# Patient Record
Sex: Female | Born: 1965 | Race: Black or African American | Hispanic: No | Smoking: Current every day smoker
Health system: Southern US, Community
[De-identification: ages and names within clinical notes are randomized; demographics above are authoritative.]

## PROBLEM LIST (undated history)

## (undated) DIAGNOSIS — E669 Obesity, unspecified: Secondary | ICD-10-CM

## (undated) DIAGNOSIS — J329 Chronic sinusitis, unspecified: Secondary | ICD-10-CM

## (undated) DIAGNOSIS — D869 Sarcoidosis, unspecified: Secondary | ICD-10-CM

## (undated) DIAGNOSIS — K279 Peptic ulcer, site unspecified, unspecified as acute or chronic, without hemorrhage or perforation: Secondary | ICD-10-CM

## (undated) HISTORY — DX: Peptic ulcer, site unspecified, unspecified as acute or chronic, without hemorrhage or perforation: K27.9

## (undated) HISTORY — PX: TUBAL LIGATION: SHX77

## (undated) HISTORY — PX: ABDOMINAL HYSTERECTOMY: SHX81

## (undated) HISTORY — DX: Chronic sinusitis, unspecified: J32.9

## (undated) HISTORY — PX: ELBOW SURGERY: SHX618

## (undated) HISTORY — PX: NASAL SINUS SURGERY: SHX719

## (undated) HISTORY — DX: Sarcoidosis, unspecified: D86.9

## (undated) HISTORY — DX: Obesity, unspecified: E66.9

---

## 1992-09-20 ENCOUNTER — Encounter: Payer: Self-pay | Admitting: Internal Medicine

## 1992-11-01 ENCOUNTER — Encounter: Payer: Self-pay | Admitting: Internal Medicine

## 1993-06-06 ENCOUNTER — Encounter: Payer: Self-pay | Admitting: Internal Medicine

## 1994-05-01 ENCOUNTER — Encounter: Payer: Self-pay | Admitting: Internal Medicine

## 1995-03-13 ENCOUNTER — Encounter: Payer: Self-pay | Admitting: Internal Medicine

## 1998-10-14 ENCOUNTER — Ambulatory Visit (HOSPITAL_COMMUNITY): Admission: RE | Admit: 1998-10-14 | Discharge: 1998-10-14 | Payer: Self-pay | Admitting: Obstetrics

## 1999-10-10 ENCOUNTER — Other Ambulatory Visit: Admission: RE | Admit: 1999-10-10 | Discharge: 1999-10-10 | Payer: Self-pay | Admitting: Obstetrics

## 1999-10-18 ENCOUNTER — Encounter: Payer: Self-pay | Admitting: Obstetrics

## 1999-10-18 ENCOUNTER — Ambulatory Visit (HOSPITAL_COMMUNITY): Admission: RE | Admit: 1999-10-18 | Discharge: 1999-10-18 | Payer: Self-pay | Admitting: Obstetrics

## 2000-11-28 ENCOUNTER — Other Ambulatory Visit: Admission: RE | Admit: 2000-11-28 | Discharge: 2000-11-28 | Payer: Self-pay | Admitting: Obstetrics

## 2000-12-04 ENCOUNTER — Encounter: Payer: Self-pay | Admitting: Obstetrics

## 2000-12-04 ENCOUNTER — Ambulatory Visit (HOSPITAL_COMMUNITY): Admission: RE | Admit: 2000-12-04 | Discharge: 2000-12-04 | Payer: Self-pay | Admitting: Obstetrics

## 2001-02-27 ENCOUNTER — Encounter: Admission: RE | Admit: 2001-02-27 | Discharge: 2001-02-27 | Payer: Self-pay | Admitting: Family Medicine

## 2001-02-27 ENCOUNTER — Encounter: Payer: Self-pay | Admitting: Family Medicine

## 2001-08-12 ENCOUNTER — Encounter (INDEPENDENT_AMBULATORY_CARE_PROVIDER_SITE_OTHER): Payer: Self-pay | Admitting: Specialist

## 2001-08-12 ENCOUNTER — Observation Stay (HOSPITAL_COMMUNITY): Admission: RE | Admit: 2001-08-12 | Discharge: 2001-08-13 | Payer: Self-pay | Admitting: Obstetrics and Gynecology

## 2001-11-19 ENCOUNTER — Other Ambulatory Visit: Admission: RE | Admit: 2001-11-19 | Discharge: 2001-11-19 | Payer: Self-pay | Admitting: Obstetrics and Gynecology

## 2002-02-25 ENCOUNTER — Ambulatory Visit (HOSPITAL_COMMUNITY): Admission: RE | Admit: 2002-02-25 | Discharge: 2002-02-25 | Payer: Self-pay | Admitting: Obstetrics and Gynecology

## 2002-02-25 ENCOUNTER — Encounter: Payer: Self-pay | Admitting: Obstetrics and Gynecology

## 2002-11-21 ENCOUNTER — Encounter: Admission: RE | Admit: 2002-11-21 | Discharge: 2002-11-21 | Payer: Self-pay | Admitting: Family Medicine

## 2002-11-21 ENCOUNTER — Encounter: Payer: Self-pay | Admitting: Family Medicine

## 2002-12-09 ENCOUNTER — Other Ambulatory Visit: Admission: RE | Admit: 2002-12-09 | Discharge: 2002-12-09 | Payer: Self-pay | Admitting: Obstetrics and Gynecology

## 2003-02-25 ENCOUNTER — Encounter (INDEPENDENT_AMBULATORY_CARE_PROVIDER_SITE_OTHER): Payer: Self-pay | Admitting: *Deleted

## 2003-02-25 ENCOUNTER — Ambulatory Visit (HOSPITAL_COMMUNITY): Admission: RE | Admit: 2003-02-25 | Discharge: 2003-02-25 | Payer: Self-pay | Admitting: Gastroenterology

## 2003-10-12 ENCOUNTER — Ambulatory Visit (HOSPITAL_COMMUNITY): Admission: RE | Admit: 2003-10-12 | Discharge: 2003-10-12 | Payer: Self-pay | Admitting: Obstetrics and Gynecology

## 2004-08-21 ENCOUNTER — Emergency Department (HOSPITAL_COMMUNITY): Admission: EM | Admit: 2004-08-21 | Discharge: 2004-08-21 | Payer: Self-pay | Admitting: Emergency Medicine

## 2004-10-19 ENCOUNTER — Ambulatory Visit (HOSPITAL_COMMUNITY): Admission: RE | Admit: 2004-10-19 | Discharge: 2004-10-19 | Payer: Self-pay | Admitting: Obstetrics and Gynecology

## 2005-10-23 ENCOUNTER — Ambulatory Visit (HOSPITAL_COMMUNITY): Admission: RE | Admit: 2005-10-23 | Discharge: 2005-10-23 | Payer: Self-pay | Admitting: Obstetrics and Gynecology

## 2006-02-20 ENCOUNTER — Encounter: Admission: RE | Admit: 2006-02-20 | Discharge: 2006-02-20 | Payer: Self-pay | Admitting: Family Medicine

## 2009-11-18 ENCOUNTER — Encounter: Payer: Self-pay | Admitting: Internal Medicine

## 2009-11-24 ENCOUNTER — Encounter: Payer: Self-pay | Admitting: Internal Medicine

## 2009-12-13 ENCOUNTER — Ambulatory Visit (HOSPITAL_COMMUNITY): Admission: RE | Admit: 2009-12-13 | Discharge: 2009-12-13 | Payer: Self-pay | Admitting: Obstetrics and Gynecology

## 2010-01-06 ENCOUNTER — Telehealth (INDEPENDENT_AMBULATORY_CARE_PROVIDER_SITE_OTHER): Payer: Self-pay | Admitting: *Deleted

## 2010-01-12 ENCOUNTER — Ambulatory Visit: Payer: Self-pay | Admitting: Internal Medicine

## 2010-01-12 ENCOUNTER — Encounter: Payer: Self-pay | Admitting: Internal Medicine

## 2010-01-12 DIAGNOSIS — J329 Chronic sinusitis, unspecified: Secondary | ICD-10-CM | POA: Insufficient documentation

## 2010-01-12 DIAGNOSIS — D869 Sarcoidosis, unspecified: Secondary | ICD-10-CM

## 2010-01-12 DIAGNOSIS — E669 Obesity, unspecified: Secondary | ICD-10-CM | POA: Insufficient documentation

## 2010-01-12 DIAGNOSIS — R05 Cough: Secondary | ICD-10-CM

## 2010-01-12 DIAGNOSIS — R059 Cough, unspecified: Secondary | ICD-10-CM | POA: Insufficient documentation

## 2010-01-12 DIAGNOSIS — R0602 Shortness of breath: Secondary | ICD-10-CM | POA: Insufficient documentation

## 2010-01-12 DIAGNOSIS — J209 Acute bronchitis, unspecified: Secondary | ICD-10-CM

## 2010-02-02 ENCOUNTER — Ambulatory Visit: Payer: Self-pay | Admitting: Internal Medicine

## 2010-08-02 NOTE — Letter (Signed)
Summary: [facility]   Imported By: Lester Loch Sheldrake 01/25/2010 09:55:17  _____________________________________________________________________  External Attachment:    Type:   Image     Comment:   External Document

## 2010-08-02 NOTE — Assessment & Plan Note (Signed)
Summary: rov/cxr/pft/apc   Visit Type:  followup Primary Jamontae Thwaites/Referring Zion Ta:  Dr.Veita Sharman Cheek  CC:  followup on pfts; a little dry cough nonproductive and pt denies sob.  History of Present Illness: IOv 01/12/2010: 45 year old AA female. Smoker. 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 Known Pulmonary Sarcoid since age 43 (see past med hx) with sarcoid sinus but asymptomatic for 19 years in terms of cough and 10 years in terms of sinusitis (s/p sinus surgery in last 13s)  States that ever since March 2011 she is having recurrnig sinusitis and bronchitis. In March 2011, developed cough, sore throat, and green sinus drainage. Noted to have fluid in left ear per hx. Diagnosed with strep throat. Rx with antibiotics and an inhaler. 2-3 days after finshing this course of abx she noticed dizziness around 10/04/2009. Reportedly found to have fluid now in left ear. Rx with meclizine. Then again 4 day acute sinus and bronchitis symtoms mid-may 2011. Again got abx. Not better. So back at Dr. Parke Simmers office on 11/24/2009. Noticed to be wheezing for first time. Got nebs in office. But by 12/06/2009 followup she was asymptmatic. But on  12/21/2009 developed green sinus drainage. Did not take abx this time but self medicated with netti pot and this has helped significantly. Currently feels fine.  She is concerned why she is having recurrent sinusitis and bronchiitis (typically has only 1 episode a year). She is worried that this might be sarcoid.  She does have a chronic dry cough since March 2011 when initial symptoms begain. Present day and night. No specific aggravating or relieving factors. STable since onset. Rates cough as mild to moderate in severity. No associated fever.  bu does have some associated mild stable dyspnea while climbing steps; also present since March 2011.   REC CXR -> Normal on 01/12/2010 Full PFT Netti pot for sinus drainageOV 02/02/2010. She is now using netti pot for sinus drainage and this has  helped cough significantly. STill some residual cough rated as 4 of 10. Cough is dry. Happens only intermittently. Feels it early in the morning. No clear cut aggravating or relieving factors. She is attending gym and dyspnea is resolved. CXR 01/12/2010 is clear. PFTs 8/32/2011 are normal but for very mild borderline reduction in DLCO to 21/3/78%.  STill smoking though  Preventive Screening-Counseling & Management  Alcohol-Tobacco     Smoking Status: current     Smoking Cessation Counseling: yes     Smoke Cessation Stage: contemplative     Packs/Day: 0.25     Year Started: 1991     Tobacco Counseling: to quit use of tobacco products  Current Medications (verified): 1)  None  Allergies (verified): 1)  ! Codeine 2)  ! 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 (1 pack a week x 20 years)  Risk Factors: Smoking Status: current (02/02/2010) Packs/Day: 0.25 (02/02/2010)  Past Medical History: Reviewed history from 01/12/2010 and no changes required. SARCOIDOSIS (ICD-135) Hx of ACUTE BRONCHITIS (ICD-466.0) OBESITY (ICD-278.00)    Past Surgical History: Reviewed history from 01/12/2010 and no changes required. Sinus surgery-1999 Partial Hysterectomy Left arm surgery (benign grown removal)  Past Pulmonary History:  Pulmonary History: #SARCOID > diagnosed age 56.  > had bald spot in scalp ("dime size") - bx  by Dr Lonni Fix reportedly showed sarcoid > also cough at diagnosis - cxr reportedly showed pulmonary sarcoid. Rx with steroids for 1 year > also subsequent diagnosis of sarcoid in nose in 1999  via biopsy; had chronic sinusitis for years with multiple ER visits  and heaeaches that was reportedly managed by Dr. Jerilee Hoh at that time. Not treated with prednisone but reportedly had some kind of sinus surgery > concern that right elbow pain was sarcoid at [facility] in [facility] > FU CT chest 11/21/2002 (per report only, archived film) - normal > CXR 02/20/2006 (actual film) - clear > CXR 01/12/2010 - actual fim is clera > PFT 02/02/2010 - normal but for DLCO of 78%  #SINUSITIS >  subsequent diagnosis of sarcoid in nose in 1999  via biopsy; had chronic sinusitis for years with multiple ER visits and heaeaches that was reportedly managed by Dr. Jerilee Hoh at that time. Not treated with prednisone but reportedly had some kind of sinus surgery > Advised Netti pot July 2011  Family History: Reviewed history from 01/12/2010 and no changes required. Family History Prostate Cancer-father  Family History C V A / Stroke -mother deceased-66 Family History Diabetes-brother, mother  Social History: Reviewed history from 01/12/2010 and no changes required. 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 (1 pack a week x 20 years)  Review of Systems      See HPI  The patient denies anorexia, fever, weight loss, weight gain, vision loss, decreased hearing, hoarseness, chest pain, syncope, dyspnea on exertion, peripheral edema, prolonged cough, headaches, hemoptysis, abdominal pain, melena, hematochezia, severe indigestion/heartburn, hematuria, incontinence, genital sores, muscle weakness, suspicious skin lesions, transient blindness, difficulty walking, depression, unusual weight change, abnormal bleeding, enlarged lymph nodes, angioedema, breast masses, and testicular masses.         mild cough  Vital Signs:  Patient profile:   45 year old female Height:      63 inches Weight:      234 pounds BMI:     41.60 O2 Sat:      99 % on Room air Temp:     97.1 degrees F oral Pulse rate:   82 / minute BP  sitting:   120 / 78  (right arm) Cuff size:   large  Vitals Entered By: Kandice Hams CMA (February 02, 2010 4:26 PM)  O2 Flow:  Room air CC: followup on pfts; a little dry cough nonproductive, pt denies sob   Physical Exam  General:  well developed, well nourished, in no acute distressobese.   Head:  normocephalic and atraumatic Eyes:  PERRLA/EOM intact; conjunctiva and sclera clear Ears:  TMs intact and clear with normal canals Nose:  no deformity, discharge, inflammation, or lesions Mouth:  no deformity or lesionsMelampatti Class II.   Neck:  no masses, thyromegaly, or abnormal cervical nodes Chest Wall:  no deformities noted Lungs:  clear bilaterally to auscultation and percussion Heart:  regular rate and rhythm, S1, S2 without murmurs, rubs, gallops, or clicks Abdomen:  bowel sounds positive; abdomen soft and non-tender without masses, or organomegaly Msk:  no deformity or scoliosis noted with normal posture Pulses:  pulses normal Extremities:  no clubbing, cyanosis, edema, or deformity noted Neurologic:  CN II-XII grossly intact with normal reflexes, coordination, muscle strength and tone Skin:  intact without lesions or rashes Cervical Nodes:  no significant adenopathy Axillary Nodes:  no significant adenopathy Psych:  alert and cooperative; normal mood and affect; normal  attention span and concentration   MISC. Report  Procedure date:  02/02/2010  Findings:      CXR 01/12/2010 is clear. PFTs 8/32/2011 are normal but for very mild borderline reduction in DLCO to 21/3/78%.   Impression & Recommendations:  Problem # 1:  SINUSITIS, RECURRENT (ICD-473.9) Assessment Improved  She has hx of recurrent sinusitis since MArch 2011. SHe  is concerned this represnts sarcoid in sinuses becuase of similarity to episodes in late 1990s. However, she hsa documented strep throat when this started per her hx. SO, I am not sure if recurrent sinusitis is truly due to sarcoid. WE discussed  ENT eval versus wait and watch approach. She chose the latter. She has used netti pot regularly since July 2011 aand has significant improvment i sinus congestion. So, I recommend continued usage of regular netti pot saline wash. If problems worsen, wil refer to ENT  Problem # 2:  SHORTNESS OF BREATH (SOB) (ICD-786.05) Assessment: Improved  Orders: T-2 View CXR (71020TC) Est. Patient Level III (16109)  mild dyspnea with exertion since episodes of resp infection in March 2011. Resolved in July 2011 after going back to gym. CXR and PFTs July/Aug 2011 are normal. Dyspnea likely due to deconditioning  plan reassure expectant followuop  Problem # 3:  COUGH (ICD-786.2) Assessment: Improved Some mild cough present despite sinus Rx. This could be cough variant asthma. We discussed getting methachoine challenge test versus empiric dulera Rx. She chose the latter udnerstandting the small risks with ICS and LABA involved. She will call me in 1 month to report progress. Depending on our conversation I might order methacholine challenge test Orders: T-2 View CXR (71020TC) Est. Patient Level III (60454)  Problem # 4:  SARCOIDOSIS (ICD-135) Assessment: Unchanged no evidence of pulmonary current sarcoid on cxr or pft.  plan reassure monitor expectantly  Medications Added to Medication List This Visit: 1)  Dulera 100-5 Mcg/act Aero (Mometasone furo-formoterol fum) .... 2 puffs twice per day  Patient Instructions: 1)  there is no evidence of sarcoid currently 2)  try low dose dulera sample inhaler 2 puff two times a day 3)  take 2 samples 4)  give me a call in 1 month to state how your cough is doing Prescriptions: DULERA 100-5 MCG/ACT AERO (MOMETASONE FURO-FORMOTEROL FUM) 2 puffs twice per day  #1 x 1   Entered and Authorized by:   Kalman Shan MD   Signed by:   Kalman Shan MD on 02/03/2010   Method used:   Electronically to        [facility] [address] [phone]* (retail)       [address]       [place], [place]  [address]       Ph: [phone] or [phone]       Fax: [phone]   RxID:   [id number]

## 2010-08-02 NOTE — Letter (Signed)
Summary: [facility]   Imported By: Lester Abingdon 01/25/2010 09:56:23  _____________________________________________________________________  External Attachment:    Type:   Image     Comment:   External Document

## 2010-08-02 NOTE — Miscellaneous (Signed)
Summary: Skin Test/[facility]  Skin Test/[facility]   Imported By: Sherian Rein 03/17/2010 07:27:50  _____________________________________________________________________  External Attachment:    Type:   Image     Comment:   External Document

## 2010-08-02 NOTE — Assessment & Plan Note (Signed)
Summary: sarcoidosis/ mbw   Visit Type:  Initial Consult Primary Provider/Referring Provider:  Dr.Veita Sharman Cheek  CC:  Pt c/o productive cough with clear mucus since March 2011. Pt c/o productive cough with green mucus June 21.Autumn Odonnell  History of Present Illness: IOv 01/12/2010: 45 year old AA female. Smoker. 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 In March 2011, developed cough, sore throat, and green sinus drainage. Noted to have fluid in left ear per hx. Diagnosed with strep throat. Rx with antibiotics and an inhaler. 2-3 days after finshing this course of abx she noticed dizziness around 10/04/2009. Reportedly found to have fluid now in left ear. Rx with meclizine. Then again 4 day acute sinus and bronchitis symtoms mid-may 2011. Again got abx. Not better. So back at Dr. Parke Simmers office on 11/24/2009. Noticed to be wheezing for first time. Got nebs in office. But by 12/06/2009 followup she was asymptmatic. But on  12/21/2009 developed green sinus drainage. Did not take abx this time but self medicated with netti pot and this has helped significantly. Currently feels fine.  She is concerned why she is having recurrent sinusitis and bronchiitis (typically has only 1 episode a year). She is worried that this might be sarcoid.  She does have a chronic dry cough since March 2011 when initial symptoms begain. Present day and night. No specific aggravating or relieving factors. STable since onset. Rates cough as mild to moderate in severity. No associated fever.  bu does have some associated mild stable dyspnea while climbing steps; also present since March 2011.  Ambulatory Pulse Oximetry  Resting; HR___105__    02 Sat___99__  Lap1 (185 feet)   HR116_____    02 Sat_99____ Lap2 (185 feet)   HR_122____   02 Sat_97____    Lap3 (185 feet)   HR__124___   02 Sat__98___  ___xTest Completed without Difficulty ___Test Stopped due to: .Kandice Hams [facility]  January 12, 2010 10:49 AM   Preventive Screening-Counseling & Management  Alcohol-Tobacco     Smoking Status: current     Smoking Cessation Counseling: yes     Smoke Cessation Stage: contemplative     Packs/Day: 0.25     Year Started: 1991     Tobacco Counseling: to quit use of tobacco products  Current Medications (verified): 1)  None  Allergies (verified): 1)  ! Codeine 2)  ! 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 (1 pack a week x 20 years)  Risk Factors: Smoking Status: current (01/12/2010) Packs/Day: 0.25 (01/12/2010)  Past Medical History: SARCOIDOSIS (ICD-135) Hx of ACUTE BRONCHITIS (ICD-466.0) OBESITY (ICD-278.00)    Past Surgical History: Sinus surgery-1999 Partial Hysterectomy Left arm surgery (benign grown removal)  Past Pulmonary History:  Pulmonary History: #SARCOID > diagnosed age 25.  > had bald spot in scalp ("dime size") - bx by Dr Lonni Fix reportedly showed sarcoid > also cough at diagnosis - cxr reportedly showed pulmonary sarcoid. Rx with steroids for 1 year > also subsequent diagnosis of sarcoid in nose  in 1999  via biopsy; had chronic sinusitis for years with multiple ER visits and heaeaches that was reportedly managed by Dr. Jerilee Hoh at that time. Not treated with prednisone but reportedly had some kind of sinus surgery > concern that right elbow pain was sarcoid at [facility] in 2001 > FU CT chest 11/21/2002 (per report only, archived film) -  normal > CXR 02/20/2006 (actual film) - clear  #SINUSITIS >  subsequent diagnosis of sarcoid in nose in 1999  via biopsy; had chronic sinusitis for years with multiple ER visits and heaeaches that was reportedly managed by Dr. Jerilee Hoh at that time. 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 (1 pack a week x 20 years) Packs/Day:  0.25  Review of Systems       The patient complains of shortness of breath with activity, productive cough, sneezing, itching, hand/feet swelling, and change in color of mucus.  The patient denies shortness of breath at rest, non-productive cough, coughing up blood, chest pain, irregular heartbeats, acid heartburn, indigestion, loss of appetite, weight change, abdominal pain, difficulty swallowing, sore throat, tooth/dental problems, headaches, nasal congestion/difficulty breathing through nose, ear ache, anxiety, depression, joint stiffness or pain, rash, and fever.    Vital Signs:  Patient profile:   45 year old female Height:      63 inches Weight:      231.8 pounds BMI:     41.21 O2 Sat:      99 % on Room air Temp:     97.1 degrees F oral Pulse rate:   90 / minute BP sitting:   120 / 88  (left arm) Cuff size:   large  Vitals Entered By: Zackery Barefoot CMA (January 12, 2010 10:29 AM)  O2 Flow:  Room air CC: Pt c/o productive cough with clear mucus since March 2011. Pt c/o productive cough with green mucus June 21. Comments Medications reviewed with patient Verified contact number and pharmacy with patient Zackery Barefoot CMA  January 12, 2010 10:30 AM    Physical Exam  General:  well developed, well nourished, in no acute distressobese.     Head:  normocephalic and atraumatic Eyes:  PERRLA/EOM intact; conjunctiva and sclera clear Ears:  TMs intact and clear with normal canals Nose:  no deformity, discharge, inflammation, or lesions Mouth:  no deformity or lesionsMelampatti Class II.   Neck:  no masses, thyromegaly, or abnormal cervical nodes Chest Wall:  no deformities noted Lungs:  clear bilaterally to auscultation and percussion Heart:  regular rate and rhythm, S1, S2 without murmurs, rubs, gallops, or clicks Abdomen:  bowel sounds positive; abdomen soft and non-tender without masses, or organomegaly Msk:  no deformity or scoliosis noted with normal posture Pulses:  pulses normal Extremities:  no clubbing, cyanosis, edema, or deformity noted Neurologic:  CN II-XII grossly intact with normal reflexes, coordination, muscle strength and tone Skin:  intact without lesions or rashes Cervical Nodes:  no significant adenopathy Axillary Nodes:  no significant adenopathy Psych:  alert and cooperative; normal mood and affect; normal attention span and concentration   CXR  Procedure date:  01/31/2006  Findings:       CXR 02/20/2006 (actual film) - clear  Comments:      independelty reviewed  Impression & Recommendations:  Problem # 1:  SINUSITIS, RECURRENT (ICD-473.9) Assessment New She has hx of recurrent sinusitis since MArch 2011. SHe  is concerned this represnts sarcoid in sinuses becuase of similarity to episodes in late 1990s. However, she hsa documented strep throat when this started per her hx. SO, I am not sure if recurrent sinusitis is truly due to sarcoid. WE discussed ENT eval versus wait and watch approach. She chose the latter which I support. I have advised regular netti pot sinus hygeine. IF recurs, will refer to ENT  Problem # 2:  Hx of ACUTE BRONCHITIS (ICD-466.0) Wheezing noted as well on 5/25. ? due toa asthma or sarcoid or just post viral.  plan cxr and full pft Orders: T-2 View CXR  (71020TC) Pulmonary Referral (Pulmonary) Consultation Level V (56213)  Problem # 3:  COUGH (ICD-786.2) Assessment: New  per hx of acute bronchitis  Orders: Consultation Level V (08657)  Problem # 4:  SHORTNESS OF BREATH (SOB) (ICD-786.05) Assessment: New mild dyspnea with exertion since episodes of resp infection in March 2011. ? due to obesity, vs deconditioning vs sarcoid vs asthma  plan full pft and cxr need to review old records if we can get hold of it  Patient Instructions: 1)  I am going to try to get your actual old records and review 2)  For your sinuses - advise netti pott atleast 3 times a week forever on regulary basis 3)  For your breathing, cough, and bronchitis - please get cxr and full breathing test and return to see me

## 2010-08-02 NOTE — Progress Notes (Signed)
Summary: New consult with Dr. Rosealee Albee appt  Phone Note Other Incoming   Caller: Ramaswamy Summary of Call: Per MR pt needs to come in sooner than the 20th of July. MR wants pt to come in next week for new pt consult. Zackery Barefoot CMA  January 06, 2010 12:03 PM   Follow-up for Phone Call        [facility] x 1 Zackery Barefoot [facility]  January 06, 2010 12:03 PM   Patient is coming to see MR on Wednesday, 01/12/2010 @ 10:40 and is okay with this date and time and knows to arrive 15 minutes early for appt. Follow-up by: Michel Bickers CMA,  January 07, 2010 9:07 AM

## 2010-08-02 NOTE — Miscellaneous (Signed)
Summary: Orders Update pft charges  Clinical Lists Changes  Orders: Added new Service order of Carbon Monoxide diffusing w/capacity (94720) - Signed Added new Service order of Lung Volumes (94240) - Signed Added new Service order of Spirometry (Pre & Post) (94060) - Signed 

## 2010-11-18 NOTE — Op Note (Signed)
[facility] of [facility]  Patient:    Autumn Odonnell, Autumn Odonnell Visit Number: [id number] MRN: [id number]          Service Type: DSU Location: 910A [id number] [id number] Attending Physician:  Lenoard Aden Dictated by:   Lenoard Aden, M.D. Proc. Date: 08/12/01 Admit Date:  08/12/2001                             Operative Report  PREOPERATIVE DIAGNOSES:       Menometrorrhagia, dysmenorrhea, fibroids.  POSTOPERATIVE DIAGNOSES:      Menometrorrhagia, dysmenorrhea, fibroids, enterocele.  PROCEDURE:                    Laparoscopically assisted vaginal hysterectomy and McCall culdoplasty.  SURGEON:                      Lenoard Aden, M.D.  ASSISTANT:                    Sheronette A. Cherly Hensen, M.D.  ANESTHESIA:                   General.  COUNTS:                       Correct.  DISPOSITION:                  Patient to recovery in good condition.  SPECIMEN:                     Uterus, fibroids, and cervix.  PROCEDURE:                    After being apprised of the risks of anesthesia, infection, bleeding, injury to abdominal organs, and need for repair, patient was brought to the operating room where she was administered general anesthesia without complications, prepped and draped in the usual sterile fashion with Foley catheter placed.  Examination under anesthesia showed a bulky retroflexed uterus and questionable left adnexal fullness.  At this time Hulka tenaculum placed per vagina.  Infraumbilical incision made with a scalpel.  After dilute Marcaine solution placed opening pressure -1 noted. CO2 3 L insufflated without difficulty setting patient pressure to 25. Auxiliary ports are placed in the bilateral lower quadrants and suprapubically three 5 mm trocar sites are made with the assistance of the scalpel and upon visualization two normal ovaries, a left subserosal fibroid which projects off to the left adnexa is noted.  Ureters are identified bilaterally.   Round ligaments are then grasped and ligated using tripolar cautery.  Manipulation is able to reveal the left tubo-ovarian round ligament which is then grasped and ligated using the tripolar.  On the right side the uterus is somewhat twisted but the ureter is identified.  The round ligament is then grasped and ligated using the tripolar cautery.  Tubo-ovarian round ligament complex is similarly ligated using the tripolar cautery.  The skeletonization of the uterine vessels bilaterally is performed using sharp dissection.  Bladder flap is developed sharply.  Cul-de-sac is clear.  Anterior cul-de-sac is clear. Attention then turned to the vaginal portion of the procedure whereby a dilute Pitressin solution is placed at the cervicovaginal junction circumferentially and dilute Pitressin solution placed.  This is then scored using cautery and posterior cul-de-sac entry made sharply.  A long weighted retractor is placed. Uterosacral ligaments are  then bilaterally clamped, suture ligated, transfixed to the vaginal cuff.  Anterior entry made.  Uterine vessels are clamped and ligated using the LigaSure in addition to the cardinal ligaments bilaterally which are then divided after using the LigaSure appropriately.  Specimen is then removed.  Enterocele is identified and closed using a 0 Vicryl McCalls culdoplasty internal suture.  Vaginal cuff is then closed using 0 Vicryl popoff sutures in an interrupted fashion from side to side.  Good hemostasis achieved.  Packing is placed.  Attention then turned to the abdominal portion of the procedure.  Small bleeders are cauterized along the vaginal cuff using tripolar cautery and irrigated is accomplished.  All blood clots are removed. Visualization reveals still normal ovaries, normal ureters, normal liver, gallbladder area, normal appendiceal area.  Atraumatic trocar entry is confirmed.  At this time CO2 is released.  All instruments are removed  under direct visualization.  Bleeding noted from the suprapubic incision and left lower quadrant incisions.  Therefore, a 0 Vicryl suture is placed deeply for hemostasis.  Dermabond placed on the right lower quadrant suture.  Suture 0 Vicryl placed deeply in the infraumbilical incision and closed using Dermabond.  Pressure dressings placed on the both the suprapubic and left lower quadrant incisions.  Clear urine is noted 150 cc.  Patient tolerated procedure well.  Is transferred to recovery in good condition. Dictated by:   Lenoard Aden, M.D. Attending Physician:  Lenoard Aden DD:  08/12/01 TD:  08/12/01 Job: [id number]

## 2010-11-18 NOTE — H&P (Signed)
[facility] of [facility]  Patient:    Autumn Odonnell, Autumn Odonnell Visit Number: [id number] MRN: [id number]          Service Type: Attending:  Lenoard Aden, M.D. Dictated by:   Lenoard Aden, M.D. Adm. Date:  08/12/01                           History and Physical  CHIEF COMPLAINT:              Dysmenorrhea, menorrhagia, and secondary anemia.  HISTORY OF PRESENT ILLNESS:   The patient is a 45 year old African-American female, G2, P1, with known severe dysmenorrhea and large submucous fibroids noted on saline sonohysterography, who proceeds for definitive therapy.  ALLERGIES:                    ASPIRIN PRODUCTS, which is a sensitivity.  MEDICATIONS:                  Darvocet and Ambien.  PAST MEDICAL HISTORY:         Remarkable for osteomyelitis in 2000, questionable history of sarcoidosis, tubal ligation in 1999, sinus surgery in 1997, one uncomplicated abortion, and one uncomplicated vaginal delivery.  FAMILY HISTORY:               Diabetes, heart disease, hypertension, and breast cancer.  SOCIAL HISTORY:               She is a smoker, a pack a day for approximately 15 years.  GYNECOLOGIC HISTORY:          Remarkable for menarche at age 86.  It is now irregular with severe dysmenorrhea and menorrhagia.  Previous history of hemoglobin of less than 10, but she has improved with iron therapy.  PHYSICAL EXAMINATION:  GENERAL:                      She is a well-developed, well-nourished African-American female in no apparent distress.  HEENT:                        Normal.  CHEST:                        Lungs are clear.  CARDIAC:                      Regular rate and rhythm.  ABDOMEN:                      Soft  and nontender.  PELVIC:                       Bulky, retroflexed uterus and no adnexal masses.  EXTREMITIES:                  No cords.  NEUROLOGIC:                   Nonfocal.  IMPRESSION:                   Symptomatic dysmenorrhea,  menometrorrhagia, with submucous fibroids on exam, for definitive therapy.  PLAN:                         Proceed with LAVH versus TAH.  Risks of anesthesia, infection, bleeding, injury to  abdominal organs and need for repair, is discussed.  Delayed versus immediate complications to include bowel and bladder injury noted.  Ovarian conservation is discussed with the patient. I also discussed with her that if her ovaries appear abnormal or in the event that there is a need to remove one due to neoplasia, that ovarian removal may be considered.  Inability to cure pain was discussed as well.  Possible need for abdominal hysterectomy was discussed with the patient and her husband prior to surgery. Dictated by:   Lenoard Aden, M.D. Attending:  Lenoard Aden, M.D. DD:  08/12/01 TD:  08/12/01 Job: [id number]

## 2010-11-18 NOTE — Discharge Summary (Signed)
[facility] of [facility]  Patient:    Autumn Odonnell, Autumn Odonnell Visit Number: [id number] MRN: [id number]          Service Type: DSU Location: 910A 9129 01 Attending Physician:  Lenoard Aden Dictated by:   Lenoard Aden, M.D. Admit Date:  08/12/2001 Discharge Date: 08/13/2001                             Discharge Summary  HOSPITAL COURSE:              The patient underwent uncomplicated LAVH, enterocele repair on August 12, 2001, without complications.  Postoperative hemoglobin 8.5.  Tolerated regular diet well.  On postoperative day #0 tolerated regular liquids.  In addition, pain relief adequate.  Incisions clean.  Vaginal packing removed on postoperative day #1.  Discharged to home.  DISCHARGE MEDICATIONS:        1. Tylox for pain.                               2. Ambien for sleep.                               3. Iron therapy once per day.  FOLLOW-UP:                    Follow up in the office in three to four days for suture removal from lower abdominal incision sites.  Discharge teaching done. Dictated by:   Lenoard Aden, M.D. Attending Physician:  Lenoard Aden DD:  08/13/01 TD:  08/13/01 Job: [id number]

## 2010-11-18 NOTE — Op Note (Signed)
Autumn Odonnell, Autumn Odonnell                     ACCOUNT NO.:  [id number]   MEDICAL RECORD NO.:  [id number]                   PATIENT TYPE:  AMB   LOCATION:  ENDO                                 FACILITY:  [facility]   PHYSICIAN:  Anselmo Rod, M.D.               DATE OF BIRTH:  June 08, 1966   DATE OF PROCEDURE:  02/25/2003  DATE OF DISCHARGE:                                 OPERATIVE REPORT   PROCEDURE PERFORMED:  Colonoscopy with biopsies times two.   ENDOSCOPIST:  Charna Elizabeth, M.D.   INSTRUMENT USED:  Olympus video colonoscope.   INDICATIONS FOR PROCEDURE:  The patient is a 45 year old African-American  female with a history of right lower quadrant pain, severe diarrhea, rectal  discomfort and mucoid stools, underwent colonoscopy to rule out inflammatory  bowel disease.   PREPROCEDURE PREPARATION:  Informed consent was procured from the patient.  The patient was fasted for eight hours prior to the procedure and prepped  with a bottle of magnesium citrate and a gallon of GoLYTELY the night prior  to the procedure.   PREPROCEDURE PHYSICAL:  The patient had stable vital signs.  Neck supple.  Chest clear to auscultation.  S1 and S2 regular.  Abdomen soft with normal  bowel sounds.   DESCRIPTION OF PROCEDURE:  The patient was placed in left lateral decubitus  position and sedated with 60 mg of Demerol and 6 mg of Versed intravenously.  Once the patient was adequately sedated and maintained on low flow oxygen  and continuous cardiac monitoring, the Olympus video colonoscope was  advanced from the rectum to the cecum and the terminal ileum.  The terminal  ileum appeared normal and without lesions.  There was no evidence of Crohn's  disease.  The appendicular orifice and ileocecal valve were clearly  visualized and photographed. There was a large amount of residual stool in  the right colon.  Multiple washes were done.  The patient's position was  changed from the left lateral to the  supine position and gentle abdominal  pressure was applied.  Two small sessile polyps were biopsied from the  rectum.  There was no evidence of inflammatory bowel disease.  The patient  tolerated the procedure well without complications.   IMPRESSION:  1. Normal colonoscopy up to the terminal ileum except for two small sessile     polyps biopsied from the rectum.  2. No evidence of inflammatory bowel disease.  3. Residual stool in the right colon.  Small lesions could have been missed.             RECOMMENDATIONS:  1. Await pathology results.  2. Continue high fiber diet.  3. Outpatient follow-up in the next two weeks for further recommendations.  Anselmo Rod, M.D.    JNM/MEDQ  D:  02/25/2003  T:  02/26/2003  Job:  [id number]   cc:   Renaye Rakers, M.D.  [phone] [address]  [place]  [place] [address]  Fax: [phone]

## 2010-12-15 ENCOUNTER — Other Ambulatory Visit (HOSPITAL_COMMUNITY): Payer: Self-pay | Admitting: Obstetrics and Gynecology

## 2010-12-15 DIAGNOSIS — Z1231 Encounter for screening mammogram for malignant neoplasm of breast: Secondary | ICD-10-CM

## 2010-12-28 ENCOUNTER — Ambulatory Visit (HOSPITAL_COMMUNITY)
Admission: RE | Admit: 2010-12-28 | Discharge: 2010-12-28 | Disposition: A | Discharge status: Home / Self Care | Payer: 59 | Source: Ambulatory Visit | Attending: Obstetrics and Gynecology | Admitting: Obstetrics and Gynecology

## 2010-12-28 DIAGNOSIS — Z1231 Encounter for screening mammogram for malignant neoplasm of breast: Secondary | ICD-10-CM | POA: Insufficient documentation

## 2011-01-02 ENCOUNTER — Other Ambulatory Visit: Payer: Self-pay | Admitting: Obstetrics and Gynecology

## 2011-01-02 DIAGNOSIS — R928 Other abnormal and inconclusive findings on diagnostic imaging of breast: Secondary | ICD-10-CM

## 2011-01-06 ENCOUNTER — Ambulatory Visit
Admission: RE | Admit: 2011-01-06 | Discharge: 2011-01-06 | Disposition: A | Discharge status: Home / Self Care | Payer: 59 | Source: Ambulatory Visit | Attending: Obstetrics and Gynecology | Admitting: Obstetrics and Gynecology

## 2011-01-06 DIAGNOSIS — R928 Other abnormal and inconclusive findings on diagnostic imaging of breast: Secondary | ICD-10-CM

## 2011-01-14 ENCOUNTER — Emergency Department (HOSPITAL_COMMUNITY)
Admission: EM | Admit: 2011-01-14 | Discharge: 2011-01-14 | Disposition: A | Discharge status: Home / Self Care | Payer: 59 | Attending: Emergency Medicine | Admitting: Emergency Medicine

## 2011-01-14 ENCOUNTER — Emergency Department (HOSPITAL_COMMUNITY): Payer: 59

## 2011-01-14 DIAGNOSIS — N83209 Unspecified ovarian cyst, unspecified side: Secondary | ICD-10-CM | POA: Insufficient documentation

## 2011-01-14 DIAGNOSIS — R1031 Right lower quadrant pain: Secondary | ICD-10-CM | POA: Insufficient documentation

## 2011-01-14 DIAGNOSIS — R10815 Periumbilic abdominal tenderness: Secondary | ICD-10-CM | POA: Insufficient documentation

## 2011-01-14 LAB — COMPREHENSIVE METABOLIC PANEL
ALT: 16 U/L (ref 0–35)
CO2: 26 mEq/L (ref 19–32)
Calcium: 9.5 mg/dL (ref 8.4–10.5)
Chloride: 105 mEq/L (ref 96–112)
Creatinine, Ser: 0.67 mg/dL (ref 0.50–1.10)
GFR calc Af Amer: >60 mL/min (ref >60)
Total Bilirubin: 0.2 mg/dL — ABNORMAL LOW (ref 0.3–1.2)
Total Protein: 7.4 g/dL (ref 6.0–8.3)

## 2011-01-14 LAB — DIFFERENTIAL
Basophils Absolute: 0 10*3/uL (ref 0.0–0.1)
Eosinophils Absolute: 0.2 10*3/uL (ref 0.0–0.7)
Lymphocytes Relative: 42 % (ref 12–46)
Lymphs Abs: 3.7 10*3/uL (ref 0.7–4.0)
Monocytes Absolute: 0.7 10*3/uL (ref 0.1–1.0)
Monocytes Relative: 8 % (ref 3–12)

## 2011-01-14 LAB — URINALYSIS, ROUTINE W REFLEX MICROSCOPIC
Ketones, ur: NEGATIVE mg/dL
Leukocytes, UA: NEGATIVE
Protein, ur: NEGATIVE mg/dL
Specific Gravity, Urine: 1.022 (ref 1.005–1.030)

## 2011-01-14 LAB — CBC
MCHC: 33.1 g/dL (ref 30.0–36.0)
MCV: 89.9 fL (ref 78.0–100.0)
RBC: 4.37 MIL/uL (ref 3.87–5.11)

## 2011-01-14 MED ORDER — IOHEXOL 300 MG/ML  SOLN
125.0000 mL | Freq: Once | INTRAMUSCULAR | Status: AC | PRN
  Administered 2011-01-14: 125 mL via INTRAVENOUS

## 2012-01-01 ENCOUNTER — Other Ambulatory Visit: Payer: Self-pay | Admitting: Obstetrics and Gynecology

## 2012-01-01 DIAGNOSIS — Z1231 Encounter for screening mammogram for malignant neoplasm of breast: Secondary | ICD-10-CM

## 2012-01-10 ENCOUNTER — Ambulatory Visit
Admission: RE | Admit: 2012-01-10 | Discharge: 2012-01-10 | Disposition: A | Discharge status: Home / Self Care | Payer: 59 | Source: Ambulatory Visit | Attending: Obstetrics and Gynecology | Admitting: Obstetrics and Gynecology

## 2012-01-10 DIAGNOSIS — Z1231 Encounter for screening mammogram for malignant neoplasm of breast: Secondary | ICD-10-CM

## 2012-02-01 ENCOUNTER — Ambulatory Visit: Payer: 59 | Admitting: Cardiology

## 2012-02-27 ENCOUNTER — Ambulatory Visit: Payer: 59 | Admitting: Cardiology

## 2012-03-12 ENCOUNTER — Encounter: Payer: Self-pay | Admitting: Cardiology

## 2012-03-12 ENCOUNTER — Encounter: Payer: Self-pay | Admitting: *Deleted

## 2012-03-13 ENCOUNTER — Other Ambulatory Visit: Payer: Self-pay | Admitting: Cardiology

## 2012-03-13 ENCOUNTER — Ambulatory Visit (INDEPENDENT_AMBULATORY_CARE_PROVIDER_SITE_OTHER): Payer: 59 | Admitting: Cardiology

## 2012-03-13 ENCOUNTER — Encounter: Payer: Self-pay | Admitting: Cardiology

## 2012-03-13 VITALS — BP 142/90 | HR 87 | Ht 63.0 in | Wt 247.0 lb

## 2012-03-13 DIAGNOSIS — R002 Palpitations: Secondary | ICD-10-CM | POA: Insufficient documentation

## 2012-03-13 DIAGNOSIS — D869 Sarcoidosis, unspecified: Secondary | ICD-10-CM

## 2012-03-13 LAB — BASIC METABOLIC PANEL WITH GFR
Calcium: 9.2 mg/dL (ref 8.4–10.5)
GFR, Est African American: >89 mL/min
GFR, Est Non African American: >89 mL/min
Glucose, Bld: 100 mg/dL — ABNORMAL HIGH (ref 70–99)
Potassium: 4.7 mEq/L (ref 3.5–5.3)
Sodium: 137 mEq/L (ref 135–145)

## 2012-03-13 NOTE — Patient Instructions (Addendum)
Your physician recommends that you schedule a follow-up appointment in: 6-8 WEEKS WITH DR Jens Som IN [place]  Your physician has requested that you have an echocardiogram. Echocardiography is a painless test that uses sound waves to create images of your heart. It provides your doctor with information about the size and shape of your heart and how well your heart's chambers and valves are working. This procedure takes approximately one hour. There are no restrictions for this procedure.   Your physician has recommended that you wear an event monitor. Event monitors are medical devices that record the heart's electrical activity. Doctors most often [place] these monitors to diagnose arrhythmias. Arrhythmias are problems with the speed or rhythm of the heartbeat. The monitor is a small, portable device. You can wear one while you do your normal daily activities. This is usually used to diagnose what is causing palpitations/syncope (passing out).   Your physician recommends that you HAVE LAB WORK TODAY

## 2012-03-13 NOTE — Progress Notes (Signed)
  HPI: 46 year old female with no prior cardiac history for evaluation of palpitations. Note she does have a history of sarcoid but states she has not had problems with this in approximately 10 years. The diagnosis was made previously by biopsying a skin lesion on her scalp. Since June she describes intermittent palpitations. They are sudden in onset and not related to activity. They last 3-5 seconds and resolve spontaneously. They are described as her heart racing. She had one occasion associated with dizziness but no syncope. She has not had chest pain or dyspnea with these episodes. She otherwise denies dyspnea on exertion, orthopnea, PND, pedal edema or exertional chest pain. Because of the above we were asked to evaluate.  Current Outpatient Prescriptions  Medication Sig Dispense Refill  . cephALEXin (KEFLEX) 500 MG capsule Take 500 mg by mouth 3 (three) times daily.      . diphenhydrAMINE (BENADRYL) 25 MG tablet Take 25 mg by mouth as needed.        Allergies  Allergen Reactions  . Codeine     REACTION: itch  . Ibuprofen     REACTION: gi upset    Past Medical History  Diagnosis Date  . Sarcoidosis   . OBESITY   . SINUSITIS, RECURRENT   . PUD (peptic ulcer disease)     Past Surgical History  Procedure Date  . Elbow surgery   . Tubal ligation   . Nasal sinus surgery   . Abdominal hysterectomy     History   Social History  . Marital Status: Legally Separated    Spouse Name: N/A    Number of Children: 1  . Years of Education: N/A   Occupational History  .     Social History Main Topics  . Smoking status: Current Every Day Smoker  . Smokeless tobacco: Not on file  . Alcohol Use: Yes     Occasional  . Drug Use: No  . Sexually Active: Not on file   Other Topics Concern  . Not on file   Social History Narrative  . No narrative on file    Family History  Problem Relation Age of Onset  . Coronary artery disease Father   . Heart disease Mother     Palpitations     ROS:  no fevers or chills, productive cough, hemoptysis, dysphasia, odynophagia, melena, hematochezia, dysuria, hematuria, rash, seizure activity, orthopnea, PND, pedal edema, claudication. Remaining systems are negative.  Physical Exam:   Blood pressure 142/90, pulse 87, height 5\' 3"  (1.6 m), weight 247 lb (112.038 kg).  General:  Well developed/obese in NAD Skin warm/dry Patient not depressed No peripheral clubbing Back-normal HEENT-normal/normal eyelids Neck supple/normal carotid upstroke bilaterally; no bruits; no JVD; no thyromegaly chest - CTA/ normal expansion CV - RRR/normal S1 and S2; no murmurs, rubs or gallops;  PMI nondisplaced Abdomen -NT/ND, no HSM, no mass, + bowel sounds, no bruit 2+ femoral pulses, no bruits Ext-no edema, chords, 2+ DP Neuro-grossly nonfocal  ECG sinus rhythm at a rate of 87. Nonspecific T-wave changes.

## 2012-03-13 NOTE — Assessment & Plan Note (Signed)
Etiology unclear. Plan CardioNet. Check TSH and potassium. She does have a history of sarcoid in the remote past. Question cardiac involvement. Schedule echocardiogram to assess LV function. Further recommendations based on above. Note she does not have a history of syncope.

## 2012-03-13 NOTE — Assessment & Plan Note (Signed)
Management per primary care. 

## 2012-03-15 LAB — TSH: TSH: 1.023 u[IU]/mL (ref 0.350–4.500)

## 2012-03-19 ENCOUNTER — Other Ambulatory Visit (HOSPITAL_COMMUNITY): Payer: 59

## 2012-03-20 ENCOUNTER — Encounter (INDEPENDENT_AMBULATORY_CARE_PROVIDER_SITE_OTHER): Payer: 59

## 2012-03-20 ENCOUNTER — Ambulatory Visit (HOSPITAL_COMMUNITY): Payer: 59 | Attending: Cardiology | Admitting: Radiology

## 2012-03-20 DIAGNOSIS — R002 Palpitations: Secondary | ICD-10-CM | POA: Insufficient documentation

## 2012-03-20 DIAGNOSIS — Z8249 Family history of ischemic heart disease and other diseases of the circulatory system: Secondary | ICD-10-CM | POA: Insufficient documentation

## 2012-03-20 DIAGNOSIS — F17200 Nicotine dependence, unspecified, uncomplicated: Secondary | ICD-10-CM | POA: Insufficient documentation

## 2012-03-20 DIAGNOSIS — I369 Nonrheumatic tricuspid valve disorder, unspecified: Secondary | ICD-10-CM | POA: Insufficient documentation

## 2012-03-20 DIAGNOSIS — I059 Rheumatic mitral valve disease, unspecified: Secondary | ICD-10-CM | POA: Insufficient documentation

## 2012-03-20 NOTE — Progress Notes (Signed)
Echocardiogram performed.  

## 2012-03-22 ENCOUNTER — Telehealth: Payer: Self-pay | Admitting: *Deleted

## 2012-03-22 NOTE — Telephone Encounter (Signed)
Pt given results of ECHO. 

## 2012-04-18 ENCOUNTER — Telehealth: Payer: Self-pay | Admitting: *Deleted

## 2012-04-18 NOTE — Telephone Encounter (Signed)
Left message for pt to call, monitor reviewed by dr crenshaw shows sinus with PVC's. 

## 2012-04-19 NOTE — Telephone Encounter (Signed)
Follow-up:   Patient returned Debra's call.  Please call back.

## 2012-04-19 NOTE — Telephone Encounter (Signed)
Pt aware of monitor results Debbie Roderic Lammert RN  

## 2012-05-01 ENCOUNTER — Ambulatory Visit (INDEPENDENT_AMBULATORY_CARE_PROVIDER_SITE_OTHER): Payer: 59 | Admitting: Cardiology

## 2012-05-01 ENCOUNTER — Encounter: Payer: Self-pay | Admitting: Cardiology

## 2012-05-01 VITALS — BP 128/80 | HR 86 | Ht 63.0 in | Wt 244.0 lb

## 2012-05-01 DIAGNOSIS — D869 Sarcoidosis, unspecified: Secondary | ICD-10-CM

## 2012-05-01 DIAGNOSIS — R002 Palpitations: Secondary | ICD-10-CM

## 2012-05-01 NOTE — Assessment & Plan Note (Signed)
LV function is normal as is TSH. Her symptoms have improved. CardioNet shows PVCs. No history of syncope. We discussed a beta blocker today but she states her symptoms are not particularly problematic. We will add a beta blocker in the future if her symptoms worsen. We will also consider a cardiac MRI in the future to rule out completely cardiac sarcoid if her symptoms worsen.

## 2012-05-01 NOTE — Patient Instructions (Addendum)
Your physician wants you to follow-up in: 6 MONTHS WITH DR CRENSHAW IN [place] You will receive a reminder letter in the mail two months in advance. If you don't receive a letter, please call our office to schedule the follow-up appointment.  

## 2012-05-01 NOTE — Progress Notes (Signed)
   HPI: Pleasant female I initially saw in Sept 2013 for evaluation of palpitations. Note she does have a history of sarcoid but states she has not had problems with this in approximately 10 years. The diagnosis was made previously by biopsying a skin lesion on her scalp. Echocardiogram in September of 2013 showed normal LV function and mild left atrial enlargement. Event monitor in October of 2013 showed sinus rhythm with PVCs. Potassium in September of 2013 4.7 and TSH 1.0-3. Since I last saw her, she continues to have palpitations but improved; described as a skip and not sustained. She has never had syncope. She denies dyspnea or chest pain.   Current Outpatient Prescriptions  Medication Sig Dispense Refill  . diphenhydrAMINE (BENADRYL) 25 MG tablet Take 25 mg by mouth as needed.         Past Medical History  Diagnosis Date  . Sarcoidosis   . OBESITY   . SINUSITIS, RECURRENT   . PUD (peptic ulcer disease)     Past Surgical History  Procedure Date  . Elbow surgery   . Tubal ligation   . Nasal sinus surgery   . Abdominal hysterectomy     History   Social History  . Marital Status: Legally Separated    Spouse Name: N/A    Number of Children: 1  . Years of Education: N/A   Occupational History  .     Social History Main Topics  . Smoking status: Current Every Day Smoker  . Smokeless tobacco: Not on file  . Alcohol Use: Yes     Occasional  . Drug Use: No  . Sexually Active: Not on file   Other Topics Concern  . Not on file   Social History Narrative  . No narrative on file    ROS: no fevers or chills, productive cough, hemoptysis, dysphasia, odynophagia, melena, hematochezia, dysuria, hematuria, rash, seizure activity, orthopnea, PND, pedal edema, claudication. Remaining systems are negative.  Physical Exam: Well-developed well-nourished in no acute distress.  Skin is warm and dry.  HEENT is normal.  Neck is supple.  Chest is clear to auscultation with normal  expansion.  Cardiovascular exam is regular rate and rhythm.  Abdominal exam nontender or distended. No masses palpated. Extremities show no edema. neuro grossly intact

## 2012-05-01 NOTE — Assessment & Plan Note (Signed)
I have asked her to arrange a followup appointment with pulmonary to follow her sarcoidosis.

## 2012-06-12 ENCOUNTER — Other Ambulatory Visit: Payer: Self-pay | Admitting: Podiatry

## 2013-03-05 ENCOUNTER — Other Ambulatory Visit: Payer: Self-pay

## 2013-03-05 DIAGNOSIS — Z1231 Encounter for screening mammogram for malignant neoplasm of breast: Secondary | ICD-10-CM

## 2013-04-01 ENCOUNTER — Ambulatory Visit: Payer: 59

## 2013-04-07 ENCOUNTER — Ambulatory Visit
Admission: RE | Admit: 2013-04-07 | Discharge: 2013-04-07 | Disposition: A | Discharge status: Home / Self Care | Payer: 59 | Source: Ambulatory Visit

## 2013-04-07 DIAGNOSIS — Z1231 Encounter for screening mammogram for malignant neoplasm of breast: Secondary | ICD-10-CM

## 2014-10-01 ENCOUNTER — Encounter: Payer: 59 | Attending: Family Medicine | Admitting: Dietician

## 2014-10-01 ENCOUNTER — Encounter: Payer: Self-pay | Admitting: Dietician

## 2014-10-01 VITALS — Ht 63.0 in | Wt 240.4 lb

## 2014-10-01 DIAGNOSIS — R7303 Prediabetes: Secondary | ICD-10-CM

## 2014-10-01 DIAGNOSIS — Z713 Dietary counseling and surveillance: Secondary | ICD-10-CM | POA: Insufficient documentation

## 2014-10-01 DIAGNOSIS — R7309 Other abnormal glucose: Secondary | ICD-10-CM | POA: Diagnosis not present

## 2014-10-01 NOTE — Progress Notes (Signed)
  Medical Nutrition Therapy:  Appt start time: 0910 end time:  1010   Assessment:  Primary concerns today: Autumn Odonnell is here today to discuss recent diagnosis of prediabetes and celiac disease (diagnosed in November 2015). Autumn Odonnell lives alone and has an adult daughter. 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 Only day off is Sunday. Has been trying to walk more and states she is committed to having a healthier lifestyle. Has lost 12 pounds and A1c has gone from 6.5% to 6.1% in the last few months. Getting 10,000 steps most days a week. Doesn't sleep well at night.   Preferred Learning Style:   No preference indicated   Learning Readiness:   Ready  Change in progress   MEDICATIONS: see list   DIETARY INTAKE:  Avoided foods include gluten, beef and pork, sweet peas, carrots, lactose.    24-hr recall:  B ( AM): usually skips, sometimes a protein shake (Premier protein shake) Snk (11 AM): grilled chickfila nuggets and fries  L (2 PM): Snickers bar D (4:30 PM): grilled chicken and veggies (from home) Snk (10:30 PM): [place]urkey bacon and eggs and gluten free English muffin  Beverages: water, occasional diet soda, occasionally coffee  Usual physical activity: 10,000 steps a day most days; walking around work building in the mornings and during lunch  Estimated energy needs: 1500-1700 calories   Progress Towards Goal(s):  In progress.   Nutritional Diagnosis:  [place]-2.2 Altered nutrition-related laboratory As related to history of erratic eating pattern and excessive carbohydrate intake.  As evidenced by elevated HgbA1c.    Intervention:  Nutrition counseling provided. Discussed utilization of different macronutrients   Teaching Method Utilized:  Visual Auditory Hands on  Handouts given during visit include:  MyPlate  Meal planning card  Nutrition Therapy for Celiac Disease  Barriers to learning/adherence to lifestyle change: none  Demonstrated degree of understanding via:   Teach Back   Monitoring/Evaluation:  Dietary intake, exercise, and body weight prn.

## 2017-04-17 ENCOUNTER — Ambulatory Visit
Admission: RE | Admit: 2017-04-17 | Discharge: 2017-04-17 | Disposition: A | Discharge status: Home / Self Care | Payer: 59 | Source: Ambulatory Visit | Attending: Gastroenterology | Admitting: Gastroenterology

## 2017-04-17 ENCOUNTER — Other Ambulatory Visit: Payer: Self-pay | Admitting: Gastroenterology

## 2017-04-17 DIAGNOSIS — R1032 Left lower quadrant pain: Secondary | ICD-10-CM

## 2017-04-17 IMAGING — CT CT ABD-PELV W/ CM
3 of 5 series · 13 of 36 positions shown, 19 images · IV contrast (iopamidol)
Comparison: [DATE]

CLINICAL DATA: Lower pelvic pain for several days. History of
diverticular rash that evaluate for diverticulitis.

EXAM:
CT ABDOMEN AND PELVIS WITH CONTRAST
TECHNIQUE: Multidetector CT imaging of the abdomen and pelvis was performed
using the standard protocol following bolus administration of
intravenous contrast.
CONTRAST:  125mL [UJ] IOPAMIDOL ([UJ]) INJECTION 61%

[Series 3: abd/pelvis with · axial · 0.86mm/px · z∈[-328,-13]mm · 6 of 89 slices shown, 11 images]
[im 13/89  soft-tissue]
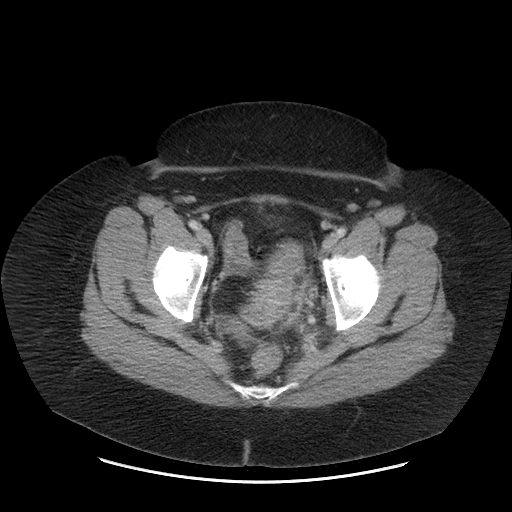
[im 13/89  bone]
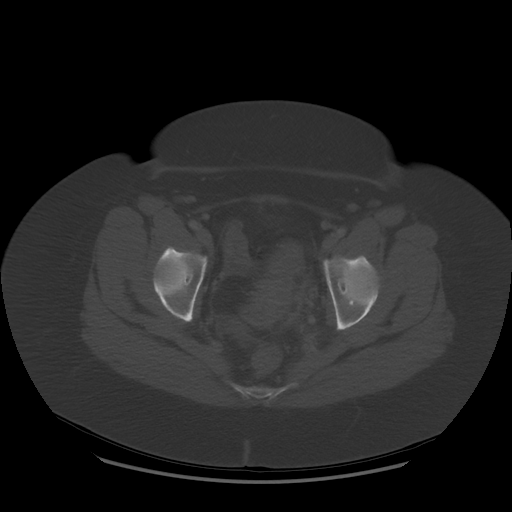
[im 26/89  soft-tissue]
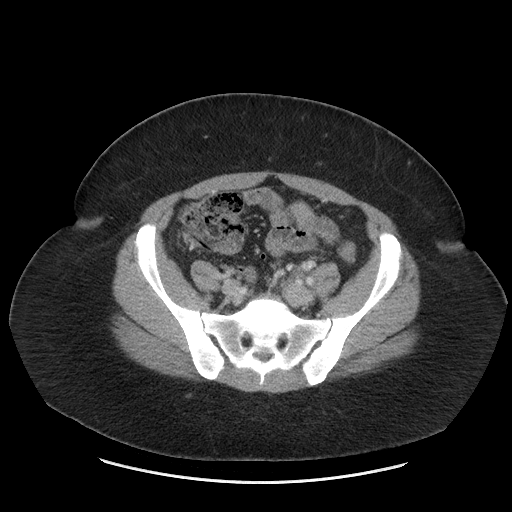
[im 38/89  soft-tissue]
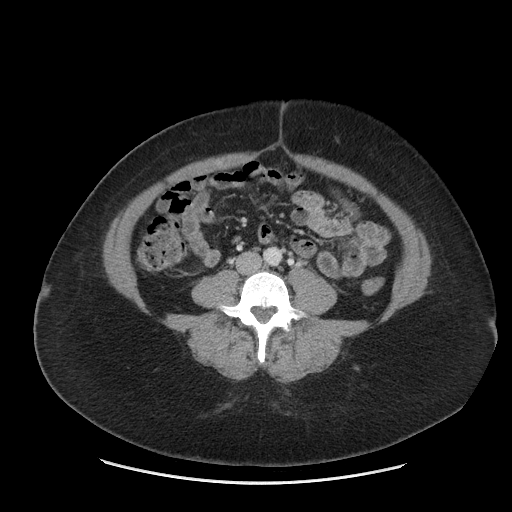
[im 38/89  lung]
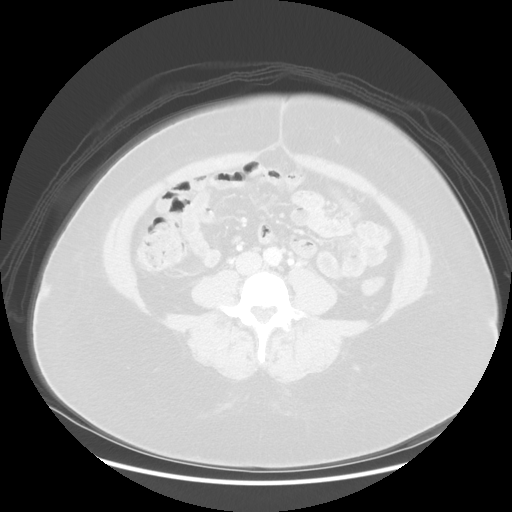
[im 51/89  soft-tissue]
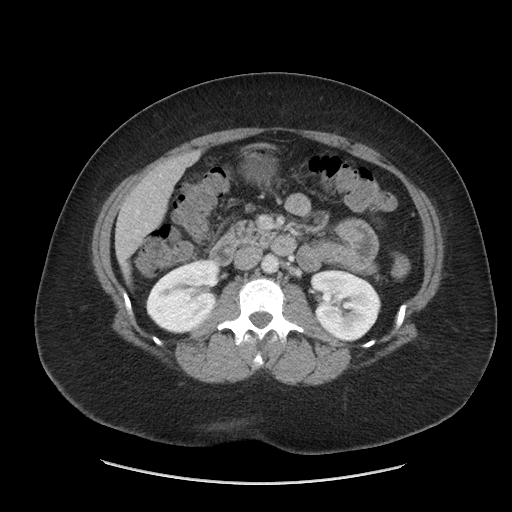
[im 51/89  lung]
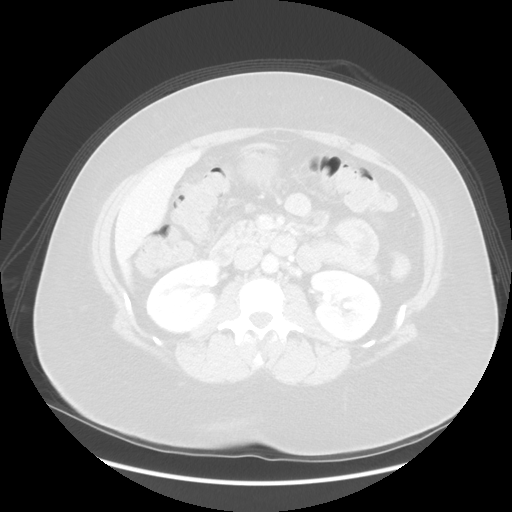
[im 63/89  soft-tissue]
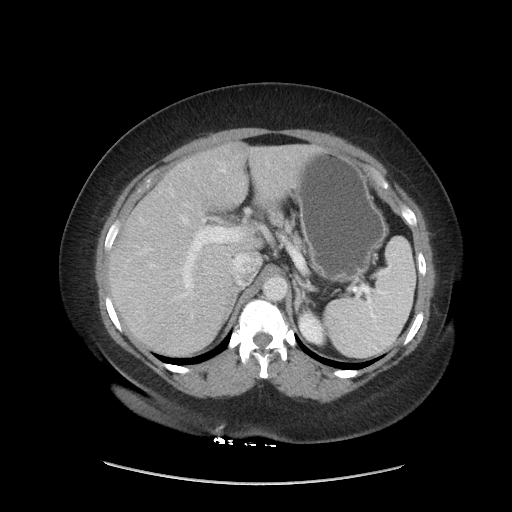
[im 63/89  lung]
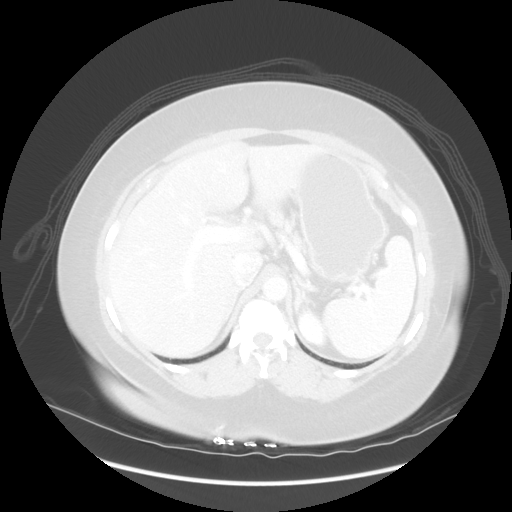
[im 76/89  soft-tissue]
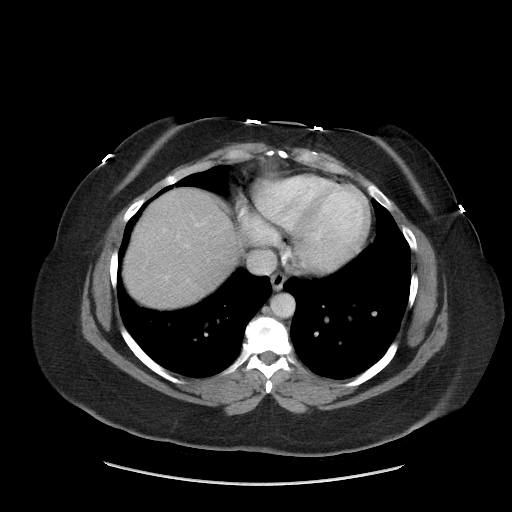
[im 76/89  lung]
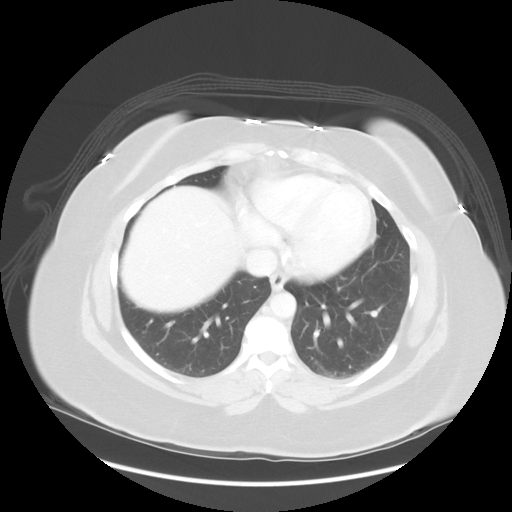

[Series 601: coronal body · coronal · 0.97mm/px · 1 of 140 slices shown, 2 images]
[im 47/140  soft-tissue]
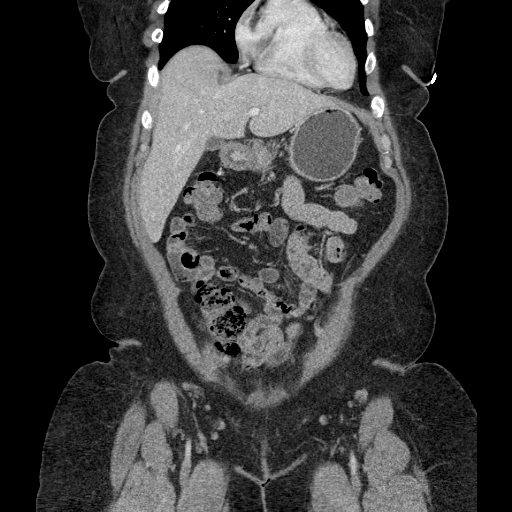
[im 47/140  bone]
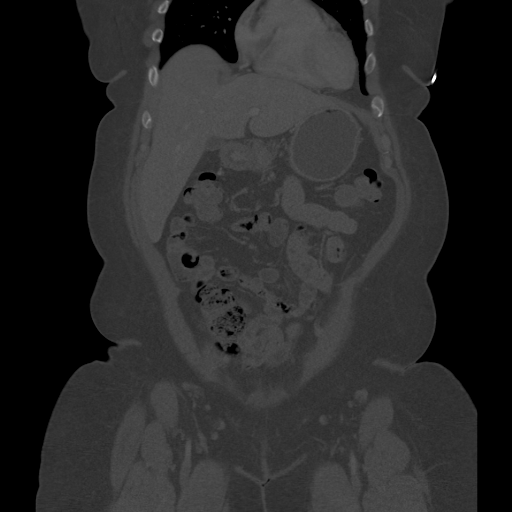

[Series 602: sagittal body · sagittal · 0.97mm/px · 6 of 178 slices shown]
[im 12/178  soft-tissue]
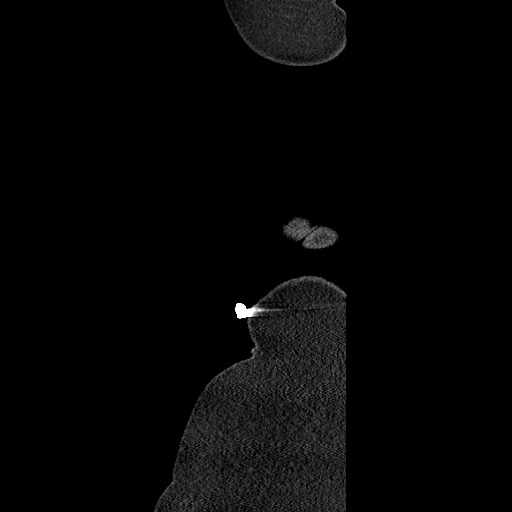
[im 34/178  soft-tissue]
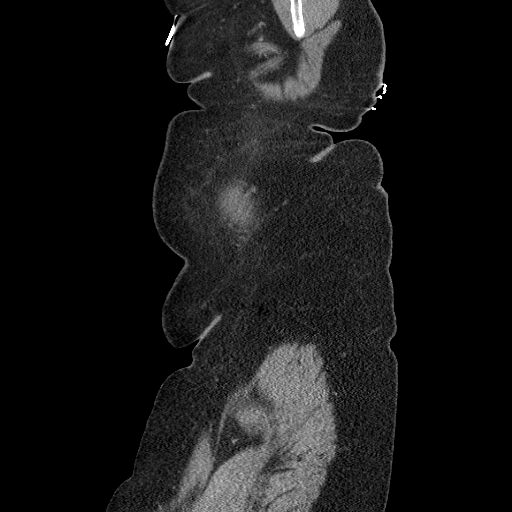
[im 56/178  soft-tissue]
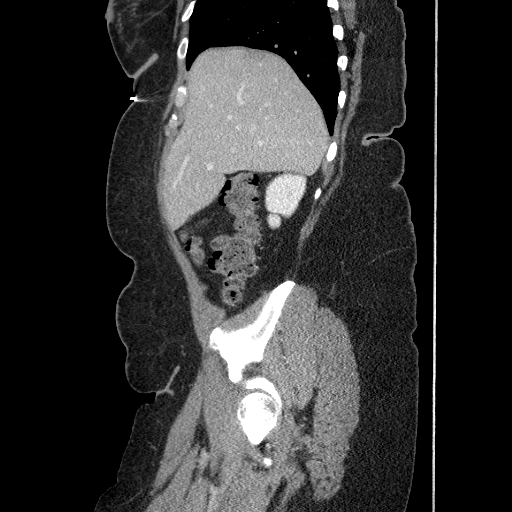
[im 78/178  soft-tissue]
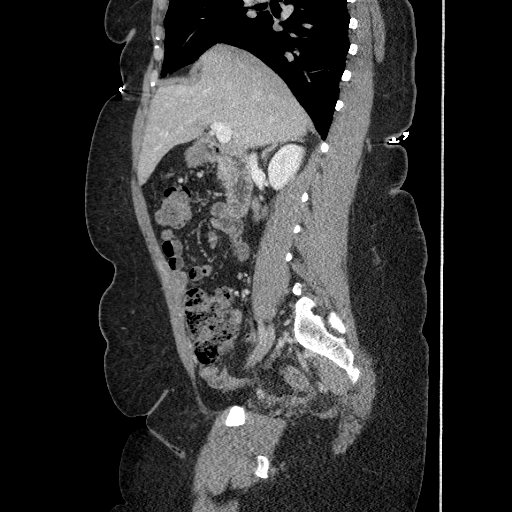
[im 100/178  soft-tissue]
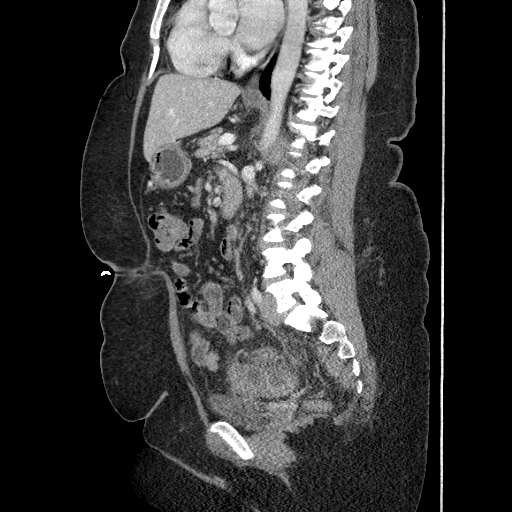
[im 122/178  soft-tissue]
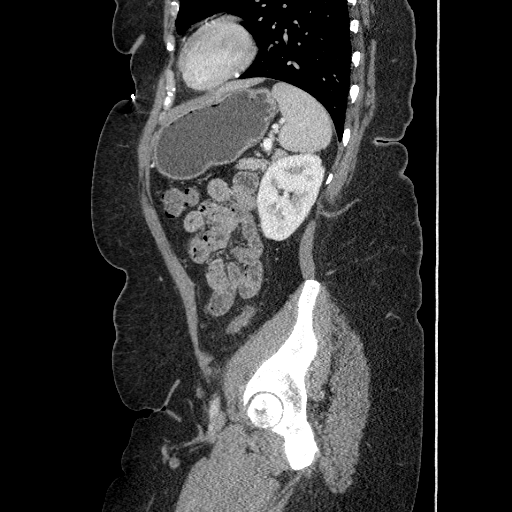

[13 of 36 positions shown; findings below may reference images not displayed]

FINDINGS: Lower chest: Lung bases are clear. No effusions. Heart is normal
size.

Hepatobiliary: No focal hepatic abnormality. Gallbladder
unremarkable.

Pancreas: No focal abnormality or ductal dilatation.

Spleen: No focal abnormality.  Normal size.

Adrenals/Urinary Tract: Small cyst in the midpole of the right
kidney posteriorly. No hydronephrosis. Adrenal glands and urinary
bladder unremarkable.

Stomach/Bowel: There is circumferential wall thickening within the
sigmoid colon with surrounding inflammatory change. Adjacent
scattered sigmoid diverticula. Findings most compatible with active
diverticulitis. Two focal low-density fluid collections are noted
within the sigmoid wall, 2.4 cm and 2.0 cm respectively, likely
small intramural abscesses. Appendix is normal. Stomach and small
bowel decompressed, unremarkable.

Vascular/Lymphatic: No evidence of aneurysm or adenopathy.

Reproductive: Prior hysterectomy.  No adnexal masses.

Other: Trace free fluid in the pelvis.  No free air.

Musculoskeletal: No acute bony abnormality.
IMPRESSION: Scattered sigmoid diverticula. There is circumferential wall
thickening within the sigmoid colon with surrounding inflammatory
change compatible with active diverticulitis. Two small intramural
abscesses noted.

## 2017-04-17 MED ORDER — IOPAMIDOL (ISOVUE-300) INJECTION 61%
125.0000 mL | Freq: Once | INTRAVENOUS | Status: AC | PRN
  Administered 2017-04-17: 125 mL via INTRAVENOUS

## 2017-04-17 NOTE — Progress Notes (Signed)
Davianna Deutschman MD 

## 2017-04-19 ENCOUNTER — Inpatient Hospital Stay (HOSPITAL_COMMUNITY)
Admission: EM | Admit: 2017-04-19 | Discharge: 2017-04-21 | DRG: 392 | Disposition: A | Discharge status: Home / Self Care | Payer: 59 | Attending: Internal Medicine | Admitting: Internal Medicine

## 2017-04-19 ENCOUNTER — Encounter (HOSPITAL_COMMUNITY): Payer: Self-pay

## 2017-04-19 DIAGNOSIS — Z8249 Family history of ischemic heart disease and other diseases of the circulatory system: Secondary | ICD-10-CM

## 2017-04-19 DIAGNOSIS — K5732 Diverticulitis of large intestine without perforation or abscess without bleeding: Secondary | ICD-10-CM | POA: Diagnosis not present

## 2017-04-19 DIAGNOSIS — Z885 Allergy status to narcotic agent status: Secondary | ICD-10-CM

## 2017-04-19 DIAGNOSIS — Z79899 Other long term (current) drug therapy: Secondary | ICD-10-CM

## 2017-04-19 DIAGNOSIS — E669 Obesity, unspecified: Secondary | ICD-10-CM | POA: Diagnosis present

## 2017-04-19 DIAGNOSIS — E876 Hypokalemia: Secondary | ICD-10-CM | POA: Diagnosis present

## 2017-04-19 DIAGNOSIS — Z9851 Tubal ligation status: Secondary | ICD-10-CM

## 2017-04-19 DIAGNOSIS — K5792 Diverticulitis of intestine, part unspecified, without perforation or abscess without bleeding: Secondary | ICD-10-CM

## 2017-04-19 DIAGNOSIS — F17200 Nicotine dependence, unspecified, uncomplicated: Secondary | ICD-10-CM | POA: Diagnosis present

## 2017-04-19 DIAGNOSIS — Z833 Family history of diabetes mellitus: Secondary | ICD-10-CM

## 2017-04-19 DIAGNOSIS — Z90710 Acquired absence of both cervix and uterus: Secondary | ICD-10-CM

## 2017-04-19 DIAGNOSIS — K900 Celiac disease: Secondary | ICD-10-CM | POA: Diagnosis present

## 2017-04-19 DIAGNOSIS — K5720 Diverticulitis of large intestine with perforation and abscess without bleeding: Principal | ICD-10-CM | POA: Diagnosis present

## 2017-04-19 DIAGNOSIS — Z9889 Other specified postprocedural states: Secondary | ICD-10-CM

## 2017-04-19 DIAGNOSIS — A419 Sepsis, unspecified organism: Secondary | ICD-10-CM | POA: Diagnosis present

## 2017-04-19 DIAGNOSIS — Z8711 Personal history of peptic ulcer disease: Secondary | ICD-10-CM

## 2017-04-19 LAB — URINALYSIS, ROUTINE W REFLEX MICROSCOPIC
GLUCOSE, UA: NEGATIVE mg/dL
Ketones, ur: 15 mg/dL — AB
Nitrite: NEGATIVE
PROTEIN: NEGATIVE mg/dL
Specific Gravity, Urine: 1.02 (ref 1.005–1.030)
pH: 5.5 (ref 5.0–8.0)

## 2017-04-19 LAB — COMPREHENSIVE METABOLIC PANEL
ALT: 22 U/L (ref 14–54)
AST: 23 U/L (ref 15–41)
Albumin: 3.3 g/dL — ABNORMAL LOW (ref 3.5–5.0)
Alkaline Phosphatase: 89 U/L (ref 38–126)
Anion gap: 11 (ref 5–15)
CHLORIDE: 101 mmol/L (ref 101–111)
CO2: 23 mmol/L (ref 22–32)
CREATININE: 0.85 mg/dL (ref 0.44–1.00)
Calcium: 8.8 mg/dL — ABNORMAL LOW (ref 8.9–10.3)
GFR calc Af Amer: >60 mL/min (ref >60)
GLUCOSE: 124 mg/dL — AB (ref 65–99)
POTASSIUM: 3.4 mmol/L — AB (ref 3.5–5.1)
SODIUM: 135 mmol/L (ref 135–145)
Total Bilirubin: 0.6 mg/dL (ref 0.3–1.2)
Total Protein: 7.4 g/dL (ref 6.5–8.1)

## 2017-04-19 LAB — CBC WITH DIFFERENTIAL/PLATELET
Basophils Absolute: 0 10*3/uL (ref 0.0–0.1)
Basophils Relative: 0 %
EOS ABS: 0.1 10*3/uL (ref 0.0–0.7)
EOS PCT: 1 %
HCT: 39.4 % (ref 36.0–46.0)
Hemoglobin: 13.2 g/dL (ref 12.0–15.0)
LYMPHS ABS: 1.9 10*3/uL (ref 0.7–4.0)
LYMPHS PCT: 12 %
MCH: 31.1 pg (ref 26.0–34.0)
MCHC: 33.5 g/dL (ref 30.0–36.0)
MCV: 92.7 fL (ref 78.0–100.0)
MONO ABS: 1.7 10*3/uL — AB (ref 0.1–1.0)
MONOS PCT: 11 %
Neutro Abs: 12.3 10*3/uL — ABNORMAL HIGH (ref 1.7–7.7)
Neutrophils Relative %: 76 %
PLATELETS: 312 10*3/uL (ref 150–400)
RBC: 4.25 MIL/uL (ref 3.87–5.11)
RDW: 13.3 % (ref 11.5–15.5)
WBC: 16.1 10*3/uL — AB (ref 4.0–10.5)

## 2017-04-19 LAB — URINALYSIS, MICROSCOPIC (REFLEX)

## 2017-04-19 LAB — I-STAT CG4 LACTIC ACID, ED
LACTIC ACID, VENOUS: 0.38 mmol/L — AB (ref 0.5–1.9)
Lactic Acid, Venous: 1.7 mmol/L (ref 0.5–1.9)

## 2017-04-19 MED ORDER — HYDROMORPHONE HCL 1 MG/ML IJ SOLN
0.5000 mg | INTRAMUSCULAR | Status: DC | PRN
  Administered 2017-04-19: 1 mg via INTRAVENOUS
  Administered 2017-04-20: 0.5 mg via INTRAVENOUS
  Administered 2017-04-20 (×3): 1 mg via INTRAVENOUS
  Filled 2017-04-19 (×6): qty 1

## 2017-04-19 MED ORDER — FENTANYL CITRATE (PF) 100 MCG/2ML IJ SOLN: 50.0000 ug | INTRAMUSCULAR | Status: DC | PRN

## 2017-04-19 MED ORDER — HYDROMORPHONE HCL 1 MG/ML IJ SOLN
1.0000 mg | Freq: Once | INTRAMUSCULAR | Status: AC
  Administered 2017-04-19: 1 mg via INTRAVENOUS
  Filled 2017-04-19: qty 1

## 2017-04-19 MED ORDER — SODIUM CHLORIDE 0.9 % IV BOLUS (SEPSIS)
1000.0000 mL | Freq: Once | INTRAVENOUS | Status: AC
  Administered 2017-04-19: 1000 mL via INTRAVENOUS

## 2017-04-19 MED ORDER — ONDANSETRON HCL 4 MG PO TABS: 4.0000 mg | ORAL_TABLET | Freq: Four times a day (QID) | ORAL | Status: DC | PRN

## 2017-04-19 MED ORDER — ONDANSETRON HCL 4 MG/2ML IJ SOLN
4.0000 mg | Freq: Four times a day (QID) | INTRAMUSCULAR | Status: DC | PRN
Start: 2017-04-19 — End: 2017-04-21
  Administered 2017-04-20: 4 mg via INTRAVENOUS
  Filled 2017-04-19 (×2): qty 2

## 2017-04-19 MED ORDER — FENTANYL CITRATE (PF) 100 MCG/2ML IJ SOLN
INTRAMUSCULAR | Status: AC
  Administered 2017-04-19: 50 ug
  Filled 2017-04-19: qty 2

## 2017-04-19 MED ORDER — ACETAMINOPHEN 325 MG PO TABS
650.0000 mg | ORAL_TABLET | Freq: Four times a day (QID) | ORAL | Status: DC | PRN
  Administered 2017-04-20 – 2017-04-21 (×4): 650 mg via ORAL
  Filled 2017-04-19 (×5): qty 2

## 2017-04-19 MED ORDER — ACETAMINOPHEN 650 MG RE SUPP: 650.0000 mg | Freq: Four times a day (QID) | RECTAL | Status: DC | PRN

## 2017-04-19 MED ORDER — PIPERACILLIN-TAZOBACTAM 3.375 G IVPB 30 MIN
3.3750 g | Freq: Once | INTRAVENOUS | Status: AC
  Administered 2017-04-19: 3.375 g via INTRAVENOUS
  Filled 2017-04-19: qty 50

## 2017-04-19 MED ORDER — ENOXAPARIN SODIUM 40 MG/0.4ML ~~LOC~~ SOLN
40.0000 mg | SUBCUTANEOUS | Status: DC
  Filled 2017-04-19 (×2): qty 0.4

## 2017-04-19 MED ORDER — SODIUM CHLORIDE 0.9 % IV BOLUS (SEPSIS)
500.0000 mL | Freq: Once | INTRAVENOUS | Status: AC
  Administered 2017-04-19: 500 mL via INTRAVENOUS

## 2017-04-19 MED ORDER — IBUPROFEN 400 MG PO TABS
400.0000 mg | ORAL_TABLET | Freq: Once | ORAL | Status: AC
  Administered 2017-04-19: 400 mg via ORAL
  Filled 2017-04-19: qty 1

## 2017-04-19 NOTE — H&P (Addendum)
History and Physical    Autumn RankSonya P Schlick AVW:[id number] DOB: 06/17/1966 DOA: 04/19/2017  PCP: Clayborn Heronankins, Victoria R, MD  Patient coming from: Home  I have personally briefly reviewed patient[facility]  Chief Complaint: Abd pain  HPI: Autumn Odonnell is a 51 y.o. female with medical history significant of PUD, obesity, sarcoidosis.  Patient presents to the ED with c/o abd pain.  Symptoms onset a couple of weeks ago.  Initially thought it was her celiac disease.  Got worse despite usual treatments.  Saw doctor 2 days ago.  CT scan done which showed diverticulitis.  Started ABx yesterday, felt worse today and presents to ED.   ED Course: Fever 102.x, tachy to 110s, WBC 16k.  Given zosyn and 3.5L IVF.  Lactate negative x2.   Review of Systems: As per HPI otherwise 10 point review of systems negative.   Past Medical History:  Diagnosis Date  . OBESITY   . PUD (peptic ulcer disease)   . Sarcoidosis   . SINUSITIS, RECURRENT     Past Surgical History:  Procedure Laterality Date  . ABDOMINAL HYSTERECTOMY    . ELBOW SURGERY    . NASAL SINUS SURGERY    . TUBAL LIGATION       reports that she has been smoking.  She has never used smokeless tobacco. She reports that she drinks alcohol. She reports that she does not use drugs.  Allergies  Allergen Reactions  . Codeine     REACTION: itch  . Ibuprofen     REACTION: gi upset    Family History  Problem Relation Age of Onset  . Diabetes Other   . Coronary artery disease Father   . Heart disease Mother        Palpitations  . Hypertension Mother   . Diabetes Mother   . Diabetes Sister   . Diabetes Brother      Prior to Admission medications   Medication Sig Start Date End Date Taking? Authorizing Provider  acetaminophen (TYLENOL) 500 MG tablet Take 500 mg by mouth every 6 (six) hours as needed for mild pain.   Yes [provider]  ciprofloxacin (CIPRO) 500 MG tablet Take 500 mg  by mouth 2 (two) times daily. Started 04/18/17 for 30 days   Yes [provider]  metroNIDAZOLE (FLAGYL) 250 MG tablet Take 250 mg by mouth 3 (three) times daily. Started 04/18/17 for 30 days   Yes [provider]    Physical Exam: Vitals:   04/19/17 2220 04/19/17 2245 04/19/17 2315 04/19/17 2333  BP:      Pulse:  (!) 109 (!) 107   Resp:  20 (!) 32   Temp: (!) 102.4 F (39.1 C)   (!) 102.1 F (38.9 C)  TempSrc: Oral   Oral  SpO2:  97% 96%     Constitutional: NAD, calm, comfortable Eyes: PERRL, lids and conjunctivae normal ENMT: Mucous membranes are moist. Posterior pharynx clear of any exudate or lesions.Normal dentition.  Neck: normal, supple, no masses, no thyromegaly Respiratory: clear to auscultation bilaterally, no wheezing, no crackles. Normal respiratory effort. No accessory muscle use.  Cardiovascular: Regular rate and rhythm, no murmurs / rubs / gallops. No extremity edema. 2+ pedal pulses. No carotid bruits.  Abdomen: LLQ tenderness with guarding, no rigidity, no rebound Musculoskeletal: no clubbing / cyanosis. No joint deformity upper and lower extremities. Good ROM, no contractures. Normal muscle tone.  Skin: no rashes, lesions, ulcers. No induration Neurologic: CN  2-12 grossly intact. Sensation intact, DTR normal. Strength 5/5 in all 4.  Psychiatric: Normal judgment and insight. Alert and oriented x 3. Normal mood.    Labs on Admission: I have personally reviewed following labs and imaging studies  CBC:  Recent Labs Lab 04/19/17 1925  WBC 16.1*  NEUTROABS 12.3*  HGB 13.2  HCT 39.4  MCV 92.7  PLT 312   Basic Metabolic Panel:  Recent Labs Lab 04/19/17 1925  NA 135  K 3.4*  CL 101  CO2 23  GLUCOSE 124*  BUN <5*  CREATININE 0.85  CALCIUM 8.8*   GFR: CrCl cannot be calculated (Unknown ideal weight.). Liver Function Tests:  Recent Labs Lab 04/19/17 1925  AST 23  ALT 22  ALKPHOS 89  BILITOT 0.6  PROT 7.4  ALBUMIN 3.3*    No results for input(s): LIPASE, AMYLASE in the last 168 hours. No results for input(s): AMMONIA in the last 168 hours. Coagulation Profile: No results for input(s): INR, PROTIME in the last 168 hours. Cardiac Enzymes: No results for input(s): CKTOTAL, CKMB, CKMBINDEX, TROPONINI in the last 168 hours. BNP (last 3 results) No results for input(s): PROBNP in the last 8760 hours. HbA1C: No results for input(s): HGBA1C in the last 72 hours. CBG: No results for input(s): GLUCAP in the last 168 hours. Lipid Profile: No results for input(s): CHOL, HDL, LDLCALC, TRIG, CHOLHDL, LDLDIRECT in the last 72 hours. Thyroid Function Tests: No results for input(s): TSH, T4TOTAL, FREET4, T3FREE, THYROIDAB in the last 72 hours. Anemia Panel: No results for input(s): VITAMINB12, FOLATE, FERRITIN, TIBC, IRON, RETICCTPCT in the last 72 hours. Urine analysis:    Component Value Date/Time   COLORURINE YELLOW 04/19/2017 1916   APPEARANCEUR HAZY (A) 04/19/2017 1916   LABSPEC 1.020 04/19/2017 1916   PHURINE 5.5 04/19/2017 1916   GLUCOSEU NEGATIVE 04/19/2017 1916   HGBUR MODERATE (A) 04/19/2017 1916   BILIRUBINUR SMALL (A) 04/19/2017 1916   KETONESUR 15 (A) 04/19/2017 1916   PROTEINUR NEGATIVE 04/19/2017 1916   UROBILINOGEN 0.2 01/14/2011 1816   NITRITE NEGATIVE 04/19/2017 1916   LEUKOCYTESUR TRACE (A) 04/19/2017 1916    Radiological Exams on Admission: No results found.  EKG: Independently reviewed.  Assessment/Plan Principal Problem:   Sigmoid diverticulitis Active Problems:   Abscess of sigmoid colon due to diverticulitis    1. Diverticulitis of sigmoid colon 1. Zosyn per pharm 2. Tylenol for fever 3. Dilaudid PRN pain 4. Clear liquid diet only 5. Repeat CBC / BMP in AM 2. Abscess of sigmoid colon - mural at this point on CT so non-operative and small at this point.  Follow clinically.  DVT prophylaxis: Lovenox Code Status: Full Family Communication: Family at bedside Disposition  Plan: Home after admit Consults called: None Admission status: Place in [place], Heywood Iles. DO [facility] Pager [phone]  If 7AM-7PM, please contact day team taking care of patient [email or web address] Password [email or web address]  04/19/2017, 11:47 PM

## 2017-04-19 NOTE — Progress Notes (Signed)
Pharmacy Antibiotic Note  Dorie RankSonya P Rothenberger is a 51 y.o. female admitted on 04/19/2017 with abdominal pain. Pharmacy has been consulted for zosyn dosing for possible intra-abdominal infection.  Tmax 102.4 and WBC 16.1. SCr 0.85 for estimated CrCl ~ 90 mL/min.   Plan: Zosyn 3.375g IV q8hr Monitor renal function, clinical picture, and culture data F/u length of therapy and de-escalation   Temp (24hrs), Avg:102.1 F (38.9 C), Min:101.9 F (38.8 C), Max:102.4 F (39.1 C)   Recent Labs Lab 04/19/17 1925 04/19/17 1950 04/19/17 2333  WBC 16.1*  --   --   CREATININE 0.85  --   --   LATICACIDVEN  --  1.70 0.38*    CrCl cannot be calculated (Unknown ideal weight.).    Allergies  Allergen Reactions  . Codeine     REACTION: itch  . Ibuprofen     REACTION: gi upset   Antimicrobials this admission: 10/18 Zosyn >   Microbiology results: pending   Einar CrowKatherine Weigle, PharmD Clinical Pharmacist 04/19/17 11:58 PM

## 2017-04-19 NOTE — ED Triage Notes (Addendum)
Pt had Ct of abd on Tuesday that revealed diverticulitis with 2 small intramural abscesses. Pt states the pain is increasing and she has had a fever the last 2 days (max 102). Pt endorses n/v. Denies diarrhea. Pt states she has not had normal BM since Monday and is only passing mucous. Pt tachycardic and febrile in triage. PT reports she has been taking cipro and flagyl x 1 day. No tylenol given in triage d/t pt last dose of tylenol at 1600

## 2017-04-19 NOTE — ED Provider Notes (Signed)
[facility] EMERGENCY DEPARTMENT Provider Note   CSN: [id number] Arrival date & time: 04/19/17  1905     History   Chief Complaint Chief Complaint  Patient presents with  . Abdominal Pain    HPI Autumn Odonnell is a 51 y.o. female.  HPI  Pt started having abdominal pain a couple of weeks ago.  She thought it was related to her celiac disease.  She tried her usual treatments.  She started feeling worse and saw her doctor earlier this week.  She was given abx after a CT scan showed diverticulitis.  Pt started to feel worse today and her pain increased.  She also started with a fever.  SHe did vomit today.   Past Medical History:  Diagnosis Date  . OBESITY   . PUD (peptic ulcer disease)   . Sarcoidosis   . SINUSITIS, RECURRENT     Patient Active Problem List   Diagnosis Date Noted  . Palpitations 03/13/2012  . SARCOIDOSIS 01/12/2010  . OBESITY 01/12/2010  . ACUTE BRONCHITIS 01/12/2010  . SINUSITIS, RECURRENT 01/12/2010  . SHORTNESS OF BREATH (SOB) 01/12/2010  . COUGH 01/12/2010    Past Surgical History:  Procedure Laterality Date  . ABDOMINAL HYSTERECTOMY    . ELBOW SURGERY    . NASAL SINUS SURGERY    . TUBAL LIGATION      OB History    No data available       Home Medications    Prior to Admission medications   Medication Sig Start Date End Date Taking? Authorizing Provider  diphenhydrAMINE (BENADRYL) 25 MG tablet Take 25 mg by mouth as needed.    [provider]  loratadine (CLARITIN) 10 MG tablet Take 10 mg by mouth daily.    [provider]  zolpidem (AMBIEN) 10 MG tablet Take 10 mg by mouth at bedtime as needed for sleep.    [provider]    Family History Family History  Problem Relation Age of Onset  . Diabetes Other   . Coronary artery disease Father   . Heart disease Mother        Palpitations  . Hypertension Mother   . Diabetes Mother   . Diabetes Sister   . Diabetes Brother      Social History Social History  Substance Use Topics  . Smoking status: Current Every Day Smoker  . Smokeless tobacco: Never Used  . Alcohol use Yes     Comment: Occasional     Allergies   Codeine and Ibuprofen   Review of Systems Review of Systems  All other systems reviewed and are negative.    Physical Exam Updated Vital Signs BP (!) 157/99   Pulse (!) 107   Temp (!) 102.4 F (39.1 C) (Oral)   Resp 19   SpO2 100%   Physical Exam  Constitutional: She appears well-developed and well-nourished. She appears distressed.  HENT:  Head: Normocephalic and atraumatic.  Right Ear: External ear normal.  Left Ear: External ear normal.  Eyes: Conjunctivae are normal. Right eye exhibits no discharge. Left eye exhibits no discharge. No scleral icterus.  Neck: Neck supple. No tracheal deviation present.  Cardiovascular: Normal rate, regular rhythm and intact distal pulses.   Pulmonary/Chest: Effort normal and breath sounds normal. No stridor. No respiratory distress. She has no wheezes. She has no rales.  Abdominal: Soft. Bowel sounds are normal. She exhibits no distension. There is tenderness in the left lower quadrant. There is guarding. There is no  rigidity and no rebound.  Musculoskeletal: She exhibits no edema or tenderness.  Neurological: She is alert. She has normal strength. No cranial nerve deficit (no facial droop, extraocular movements intact, no slurred speech) or sensory deficit. She exhibits normal muscle tone. She displays no seizure activity. Coordination normal.  Skin: Skin is warm and dry. No rash noted.  Psychiatric: She has a normal mood and affect.  Nursing note and vitals reviewed.    ED Treatments / Results  Labs (all labs ordered are listed, but only abnormal results are displayed) Labs Reviewed  COMPREHENSIVE METABOLIC PANEL - Abnormal; Notable for the following:       Result Value   Potassium 3.4 (*)    Glucose, Bld 124 (*)    BUN <5 (*)     Calcium 8.8 (*)    Albumin 3.3 (*)    All other components within normal limits  CBC WITH DIFFERENTIAL/PLATELET - Abnormal; Notable for the following:    WBC 16.1 (*)    Neutro Abs 12.3 (*)    Monocytes Absolute 1.7 (*)    All other components within normal limits  URINALYSIS, ROUTINE W REFLEX MICROSCOPIC - Abnormal; Notable for the following:    APPearance HAZY (*)    Hgb urine dipstick MODERATE (*)    Bilirubin Urine SMALL (*)    Ketones, ur 15 (*)    Leukocytes, UA TRACE (*)    All other components within normal limits  URINALYSIS, MICROSCOPIC (REFLEX) - Abnormal; Notable for the following:    Bacteria, UA FEW (*)    Squamous Epithelial / LPF 6-30 (*)    All other components within normal limits  CULTURE, BLOOD (ROUTINE X 2)  CULTURE, BLOOD (ROUTINE X 2)  I-STAT CG4 LACTIC ACID, ED  I-STAT CG4 LACTIC ACID, ED     Procedures Procedures (including critical care time)  Medications Ordered in ED Medications  fentaNYL (SUBLIMAZE) 100 MCG/2ML injection (50 mcg  Given 04/19/17 1925)  sodium chloride 0.9 % bolus 1,000 mL (1,000 mLs Intravenous New Bag/Given 04/19/17 2138)    And  sodium chloride 0.9 % bolus 1,000 mL (1,000 mLs Intravenous New Bag/Given 04/19/17 2138)    And  sodium chloride 0.9 % bolus 1,000 mL (0 mLs Intravenous Stopped 04/19/17 2219)    And  sodium chloride 0.9 % bolus 500 mL (500 mLs Intravenous New Bag/Given 04/19/17 2139)  piperacillin-tazobactam (ZOSYN) IVPB 3.375 g (0 g Intravenous Stopped 04/19/17 2219)  HYDROmorphone (DILAUDID) injection 1 mg (1 mg Intravenous Given 04/19/17 2132)  ibuprofen (ADVIL,MOTRIN) tablet 400 mg (400 mg Oral Given 04/19/17 2226)     Initial Impression / Assessment and Plan / ED Course  I have reviewed the triage vital signs and the nursing notes.  Pertinent labs & imaging results that were available during my care of the patient were reviewed by me and considered in my medical decision making (see chart for details).    patient presented to the emergency room for evaluation of worsening symptoms associated with acute diverticulitis.  Patient had a CT scan of her abdomen pelvis on October 16, 2 days ago. Those findings were reviewed.  The patient had circumferential wall thickening of the sigmoid colon with surrounding inflammatory changes compatible with acute diverticulitis. There are 2 small intrarenal abscesses but no evidence of perforation or free air.  Patient had been taking oral antibiotics but her symptoms worsened. Here in the emergency room she is febrile but no evidence of any sepsis. IV antibiotics and pain medications were  given. I will consult the medical service for admission and further treatment.  Final Clinical Impressions(s) / ED Diagnoses   Final diagnoses:  Acute diverticulitis of intestine       Linwood Dibbles, MD 04/19/17 2240

## 2017-04-20 DIAGNOSIS — K5720 Diverticulitis of large intestine with perforation and abscess without bleeding: Secondary | ICD-10-CM | POA: Diagnosis present

## 2017-04-20 DIAGNOSIS — K900 Celiac disease: Secondary | ICD-10-CM | POA: Diagnosis present

## 2017-04-20 DIAGNOSIS — Z90710 Acquired absence of both cervix and uterus: Secondary | ICD-10-CM | POA: Diagnosis not present

## 2017-04-20 DIAGNOSIS — E669 Obesity, unspecified: Secondary | ICD-10-CM | POA: Diagnosis present

## 2017-04-20 DIAGNOSIS — Z8711 Personal history of peptic ulcer disease: Secondary | ICD-10-CM | POA: Diagnosis not present

## 2017-04-20 DIAGNOSIS — E876 Hypokalemia: Secondary | ICD-10-CM | POA: Diagnosis present

## 2017-04-20 DIAGNOSIS — Z9851 Tubal ligation status: Secondary | ICD-10-CM | POA: Diagnosis not present

## 2017-04-20 DIAGNOSIS — Z833 Family history of diabetes mellitus: Secondary | ICD-10-CM | POA: Diagnosis not present

## 2017-04-20 DIAGNOSIS — Z9889 Other specified postprocedural states: Secondary | ICD-10-CM | POA: Diagnosis not present

## 2017-04-20 DIAGNOSIS — Z885 Allergy status to narcotic agent status: Secondary | ICD-10-CM | POA: Diagnosis not present

## 2017-04-20 DIAGNOSIS — F17200 Nicotine dependence, unspecified, uncomplicated: Secondary | ICD-10-CM | POA: Diagnosis present

## 2017-04-20 DIAGNOSIS — K5732 Diverticulitis of large intestine without perforation or abscess without bleeding: Secondary | ICD-10-CM | POA: Diagnosis not present

## 2017-04-20 DIAGNOSIS — Z8249 Family history of ischemic heart disease and other diseases of the circulatory system: Secondary | ICD-10-CM | POA: Diagnosis not present

## 2017-04-20 DIAGNOSIS — Z79899 Other long term (current) drug therapy: Secondary | ICD-10-CM | POA: Diagnosis not present

## 2017-04-20 DIAGNOSIS — K5792 Diverticulitis of intestine, part unspecified, without perforation or abscess without bleeding: Secondary | ICD-10-CM | POA: Diagnosis present

## 2017-04-20 LAB — CBC
HEMATOCRIT: 33.7 % — AB (ref 36.0–46.0)
Hemoglobin: 11 g/dL — ABNORMAL LOW (ref 12.0–15.0)
MCH: 30.4 pg (ref 26.0–34.0)
MCHC: 32.6 g/dL (ref 30.0–36.0)
MCV: 93.1 fL (ref 78.0–100.0)
PLATELETS: 263 10*3/uL (ref 150–400)
RBC: 3.62 MIL/uL — ABNORMAL LOW (ref 3.87–5.11)
RDW: 13.3 % (ref 11.5–15.5)
WBC: 14.6 10*3/uL — ABNORMAL HIGH (ref 4.0–10.5)

## 2017-04-20 LAB — BASIC METABOLIC PANEL
Anion gap: 7 (ref 5–15)
BUN: <5 mg/dL — ABNORMAL LOW (ref 6–20)
CALCIUM: 7.5 mg/dL — AB (ref 8.9–10.3)
CO2: 22 mmol/L (ref 22–32)
CREATININE: 0.74 mg/dL (ref 0.44–1.00)
Chloride: 110 mmol/L (ref 101–111)
GFR calc non Af Amer: >60 mL/min (ref >60)
GLUCOSE: 115 mg/dL — AB (ref 65–99)
Potassium: 3.2 mmol/L — ABNORMAL LOW (ref 3.5–5.1)
Sodium: 139 mmol/L (ref 135–145)

## 2017-04-20 LAB — HIV ANTIBODY (ROUTINE TESTING W REFLEX): HIV SCREEN 4TH GENERATION: NONREACTIVE

## 2017-04-20 MED ORDER — POTASSIUM CHLORIDE CRYS ER 20 MEQ PO TBCR
40.0000 meq | EXTENDED_RELEASE_TABLET | Freq: Once | ORAL | Status: AC
  Administered 2017-04-20: 40 meq via ORAL
  Filled 2017-04-20: qty 2

## 2017-04-20 MED ORDER — PIPERACILLIN-TAZOBACTAM 3.375 G IVPB
3.3750 g | Freq: Three times a day (TID) | INTRAVENOUS | Status: DC
  Administered 2017-04-20 – 2017-04-21 (×4): 3.375 g via INTRAVENOUS
  Filled 2017-04-20 (×5): qty 50

## 2017-04-20 NOTE — ED Notes (Signed)
Pt placed on hospital bed

## 2017-04-20 NOTE — Progress Notes (Signed)
Pt admitted to the unit at 1558. Pt mental status is A&Ox4. Pt oriented to room, staff, and call bell. Skin is intact except where otherwise charted. Full assessment charted in [facility]. Call bell within reach. Visitor guidelines reviewed w/ pt and/or family.  

## 2017-04-20 NOTE — ED Notes (Signed)
Ordered hospital bed 

## 2017-04-20 NOTE — Progress Notes (Signed)
PROGRESS NOTE    Autumn Odonnell  AVW:[id number] DOB: Nov 28, 1965 DOA: 04/19/2017 PCP: Clayborn Heron, MD  Brief Narrative:Autumn Odonnell is a 51 y.o. female with medical history significant of PUD, obesity, sarcoidosis.  Patient presents to the ED with c/o abd pain.  Symptoms onset a couple of weeks ago.  Initially thought it was her celiac disease.  Got worse despite usual treatments.  Saw doctor 2 days ago.  CT scan done which showed diverticulitis In the emergency room noted to have fever 102.x, tachy to 110s, WBC 16k   Assessment & Plan:   Principal Problem:   Sigmoid diverticulitis with small intramural abscess -Since the abscesses are small and intramural they will be treated nonoperatively -Continue Zosyn per pharmacy, supportive care with IV fluids antiemetics -Clears -If any worsening of symptoms low threshold to re-image -Patient has had a colonoscopy which noted diverticulosis in the past  Sarcoidosis -Remote history more than 20 years ago, asymptomatic -Follow-up with pulmonary   Hypokalemia -Replace  DVT prophylaxis: lovenox Code Status: Full code Family Communication: Daughter at bedside Disposition Plan: Home in 1-2 days   Procedures:   Antimicrobials:  IV Zosyn 10/18  Subjective: Feels better, pain better controlled, no further nausea vomiting  Objective: Vitals:   04/20/17 0729 04/20/17 0900 04/20/17 1000 04/20/17 1100  BP:  125/78 116/83 118/77  Pulse:  82 78   Resp:  (!) 22 (!) 28 (!) 24  Temp: 98.5 F (36.9 C)     TempSrc: Oral     SpO2:  94% 97%   Weight:      Height:        Intake/Output Summary (Last 24 hours) at 04/20/17 1319 Last data filed at 04/20/17 0918  Gross per 24 hour  Intake             3600 ml  Output                0 ml  Net             3600 ml   Filed Weights   04/20/17 0047  Weight: 108 kg (238 lb)    Examination:  General exam: Appears calm and comfortable  Respiratory system: Clear to  auscultation. Respiratory effort normal. Cardiovascular system: S1 & S2 heard, RRR. No JVD, murmurs, rubs, gallops Gastrointestinal system: Abdomen is nondistended, soft and mild lower quadrant tenderness.Normal bowel sounds heard. Central nervous system: Alert and oriented. No focal neurological deficits. Extremities: Symmetric 5 x 5 power. Skin: No rashes, lesions or ulcers Psychiatry: Judgement and insight appear normal. Mood & affect appropriate.     Data Reviewed:   CBC:  Recent Labs Lab 04/19/17 1925 04/20/17 0333  WBC 16.1* 14.6*  NEUTROABS 12.3*  --   HGB 13.2 11.0*  HCT 39.4 33.7*  MCV 92.7 93.1  PLT 312 263   Basic Metabolic Panel:  Recent Labs Lab 04/19/17 1925 04/20/17 0333  NA 135 139  K 3.4* 3.2*  CL 101 110  CO2 23 22  GLUCOSE 124* 115*  BUN <5* <5*  CREATININE 0.85 0.74  CALCIUM 8.8* 7.5*   GFR: Estimated Creatinine Clearance: 95.8 mL/min (by C-G formula based on SCr of 0.74 mg/dL). Liver Function Tests:  Recent Labs Lab 04/19/17 1925  AST 23  ALT 22  ALKPHOS 89  BILITOT 0.6  PROT 7.4  ALBUMIN 3.3*   No results for input(s): LIPASE, AMYLASE in the last 168 hours. No results for input(s): AMMONIA in the last 168  hours. Coagulation Profile: No results for input(s): INR, PROTIME in the last 168 hours. Cardiac Enzymes: No results for input(s): CKTOTAL, CKMB, CKMBINDEX, TROPONINI in the last 168 hours. BNP (last 3 results) No results for input(s): PROBNP in the last 8760 hours. HbA1C: No results for input(s): HGBA1C in the last 72 hours. CBG: No results for input(s): GLUCAP in the last 168 hours. Lipid Profile: No results for input(s): CHOL, HDL, LDLCALC, TRIG, CHOLHDL, LDLDIRECT in the last 72 hours. Thyroid Function Tests: No results for input(s): TSH, T4TOTAL, FREET4, T3FREE, THYROIDAB in the last 72 hours. Anemia Panel: No results for input(s): VITAMINB12, FOLATE, FERRITIN, TIBC, IRON, RETICCTPCT in the last 72 hours. Urine  analysis:    Component Value Date/Time   COLORURINE YELLOW 04/19/2017 1916   APPEARANCEUR HAZY (A) 04/19/2017 1916   LABSPEC 1.020 04/19/2017 1916   PHURINE 5.5 04/19/2017 1916   GLUCOSEU NEGATIVE 04/19/2017 1916   HGBUR MODERATE (A) 04/19/2017 1916   BILIRUBINUR SMALL (A) 04/19/2017 1916   KETONESUR 15 (A) 04/19/2017 1916   PROTEINUR NEGATIVE 04/19/2017 1916   UROBILINOGEN 0.2 01/14/2011 1816   NITRITE NEGATIVE 04/19/2017 1916   LEUKOCYTESUR TRACE (A) 04/19/2017 1916   Sepsis Labs: @LABRCNTIP (procalcitonin:4,lacticidven:4)  )No results found for this or any previous visit (from the past 240 hour(s)).       Radiology Studies: No results found.      Scheduled Meds: . enoxaparin (LOVENOX) injection  40 mg Subcutaneous Q24H   Continuous Infusions: . piperacillin-tazobactam (ZOSYN)  IV Stopped (04/20/17 0918)     LOS: 0 days    Time spent: 35min    Zannie CovePreetha Tijuan Dantes, MD [facility] Page via [email or web address], password [email or web address] After 7PM please contact night-coverage  04/20/2017, 1:19 PM

## 2017-04-20 NOTE — ED Notes (Signed)
Pt meal ordered.

## 2017-04-21 DIAGNOSIS — K5732 Diverticulitis of large intestine without perforation or abscess without bleeding: Secondary | ICD-10-CM

## 2017-04-21 LAB — CBC
HCT: 33.6 % — ABNORMAL LOW (ref 36.0–46.0)
Hemoglobin: 10.9 g/dL — ABNORMAL LOW (ref 12.0–15.0)
MCH: 30.4 pg (ref 26.0–34.0)
MCHC: 32.4 g/dL (ref 30.0–36.0)
MCV: 93.6 fL (ref 78.0–100.0)
PLATELETS: 286 10*3/uL (ref 150–400)
RBC: 3.59 MIL/uL — ABNORMAL LOW (ref 3.87–5.11)
RDW: 13.6 % (ref 11.5–15.5)
WBC: 9.5 10*3/uL (ref 4.0–10.5)

## 2017-04-21 LAB — BASIC METABOLIC PANEL
ANION GAP: 6 (ref 5–15)
BUN: <5 mg/dL — ABNORMAL LOW (ref 6–20)
CALCIUM: 8.5 mg/dL — AB (ref 8.9–10.3)
CO2: 26 mmol/L (ref 22–32)
Chloride: 106 mmol/L (ref 101–111)
Creatinine, Ser: 0.7 mg/dL (ref 0.44–1.00)
GLUCOSE: 100 mg/dL — AB (ref 65–99)
Potassium: 3.3 mmol/L — ABNORMAL LOW (ref 3.5–5.1)
Sodium: 138 mmol/L (ref 135–145)

## 2017-04-21 MED ORDER — AMOXICILLIN-POT CLAVULANATE 875-125 MG PO TABS: 1.0000 | ORAL_TABLET | Freq: Two times a day (BID) | ORAL | 0 refills | Status: AC

## 2017-04-21 MED ORDER — POTASSIUM CHLORIDE CRYS ER 20 MEQ PO TBCR
20.0000 meq | EXTENDED_RELEASE_TABLET | Freq: Two times a day (BID) | ORAL | Status: DC
  Administered 2017-04-21: 20 meq via ORAL
  Filled 2017-04-21: qty 1

## 2017-04-21 MED ORDER — ONDANSETRON HCL 4 MG PO TABS: 4.0000 mg | ORAL_TABLET | Freq: Four times a day (QID) | ORAL | 0 refills | Status: AC | PRN

## 2017-04-21 MED ORDER — POTASSIUM CHLORIDE CRYS ER 20 MEQ PO TBCR: 20.0000 meq | EXTENDED_RELEASE_TABLET | Freq: Every day | ORAL | 0 refills | Status: AC

## 2017-04-21 MED ORDER — AMOXICILLIN-POT CLAVULANATE 875-125 MG PO TABS
1.0000 | ORAL_TABLET | Freq: Two times a day (BID) | ORAL | Status: DC
  Administered 2017-04-21: 1 via ORAL
  Filled 2017-04-21: qty 1

## 2017-04-21 NOTE — Progress Notes (Signed)
Autumn Odonnell to be D/C'd Home per MD order.  Discussed with the patient and all questions fully answered.  VSS, Skin clean, dry and intact without evidence of skin break down, no evidence of skin tears noted. IV catheter discontinued intact. Site without signs and symptoms of complications. Dressing and pressure applied.  An After Visit Summary was printed and given to the patient. Patient received prescription.  D/c education completed with patient/family including follow up instructions, medication list, d/c activities limitations if indicated, with other d/c instructions as indicated by MD - patient able to verbalize understanding, all questions fully answered.   Patient instructed to return to ED, call 911, or call MD for any changes in condition.   Pt refused escort and D/C'd home via private auto.  Grayling Congressvan J Arleigh Dicola 04/21/2017 11:54 AM

## 2017-04-21 NOTE — Discharge Summary (Signed)
Autumn Odonnell, is a 51 y.o. female  DOB Jun 22, 1966  MRN [id number].  Admission date:  04/19/2017  Admitting Physician  Hillary Bow, DO  Discharge Date:  04/21/2017   Primary MD  Rankins, Fanny Dance, MD  Recommendations for primary care physician for things to follow:  Check bmet and CBC   Admission Diagnosis  Acute diverticulitis of intestine [K57.92]   Discharge Diagnosis  Acute diverticulitis of intestine [K57.92]    Principal Problem:   Sigmoid diverticulitis Active Problems:   Abscess of sigmoid colon due to diverticulitis      Past Medical History:  Diagnosis Date  . OBESITY   . PUD (peptic ulcer disease)   . Sarcoidosis   . SINUSITIS, RECURRENT     Past Surgical History:  Procedure Laterality Date  . ABDOMINAL HYSTERECTOMY    . ELBOW SURGERY    . NASAL SINUS SURGERY    . TUBAL LIGATION         HPI  from the history and physical done on the day of admission:   Autumn P Scarboroughis a 51 y.o.femalewith medical history significant of PUD, obesity, sarcoidosis. Patient presents to the ED with c/o abd pain. Symptoms onset a couple of weeks ago. Initially thought it was her celiac disease. Got worse despite usual treatments. Saw doctor 2 days ago. CT scan done which showed diverticulitis In the emergency room noted to have fever 102.x, tachy to 110s, WBC 16k      Hospital Course:   Autumn Odonnell is a 51 y.o. female with medical history significant of PUD, obesity, sarcoidosis.  Patient presents to the ED with c/o abd pain.  Symptoms onset a couple of weeks ago.  Initially thought it was her celiac disease.  Got worse despite usual treatments.  Saw doctor 2 days ago.  CT scan done which showed diverticulitis.  Started ABx yesterday, prior to admission with metronidazole and Cipro without improvement and actually felt worse on day of of admission and  therefore presented to the ED for further evaluation, and subsequently admitted for failed outpatient treatment of acute diverticulitis  ED Course: Fever 102.x, tachy to 110s, WBC 16k.  Given zosyn and 3.5L IVF.  Lactate negative x2.  On admission patient was continued on IV antibiotic with Zosyn with monitoring of electrolytes and renal function with IV fluids and supportive care with analgesics.  She had hypokalemia which was treated appropriately in our office this morning patient is afebrile without any abdominal pain nausea significantly improved and she has been ambulatory and open her bowels without any problems and was deemed appropriate for discharge today.     Discharge Condition: Stable  Follow UP     Consults obtained -not applicable  Diet and Activity recommendation:  As advised  Discharge Instructions     Discharge Instructions    Call MD for:  difficulty breathing, headache or visual disturbances    Complete by:  As directed    Call MD for:  extreme fatigue    Complete  by:  As directed    Call MD for:  hives    Complete by:  As directed    Call MD for:  persistant dizziness or light-headedness    Complete by:  As directed    Call MD for:  persistant nausea and vomiting    Complete by:  As directed    Call MD for:  redness, tenderness, or signs of infection (pain, swelling, redness, odor or green/yellow discharge around incision site)    Complete by:  As directed    Call MD for:  severe uncontrolled pain    Complete by:  As directed    Call MD for:  temperature >100.4    Complete by:  As directed    Diet - low sodium heart healthy    Complete by:  As directed    Increase activity slowly    Complete by:  As directed         Discharge Medications     Allergies as of 04/21/2017      Reactions   Codeine    REACTION: itch   Ibuprofen    REACTION: gi upset      Medication List    STOP taking these medications   ciprofloxacin 500 MG  tablet Commonly known as:  CIPRO   metroNIDAZOLE 250 MG tablet Commonly known as:  FLAGYL     TAKE these medications   acetaminophen 500 MG tablet Commonly known as:  TYLENOL Take 500 mg by mouth every 6 (six) hours as needed for mild pain.   amoxicillin-clavulanate 875-125 MG tablet Commonly known as:  AUGMENTIN Take 1 tablet by mouth every 12 (twelve) hours.   ondansetron 4 MG tablet Commonly known as:  ZOFRAN Take 1 tablet (4 mg total) by mouth every 6 (six) hours as needed for nausea.   potassium chloride SA 20 MEQ tablet Commonly known as:  K-DUR,KLOR-CON Take 1 tablet (20 mEq total) by mouth daily.       Major procedures and Radiology Reports - PLEASE review detailed and final reports for all details, in brief   Ct Abdomen Pelvis W Contrast  Result Date: 04/17/2017 CLINICAL DATA:  Lower pelvic pain for several days. History of diverticular rash that evaluate for diverticulitis. EXAM: CT ABDOMEN AND PELVIS WITH CONTRAST TECHNIQUE: Multidetector CT imaging of the abdomen and pelvis was performed using the standard protocol following bolus administration of intravenous contrast. CONTRAST:  ISOVUE-300 IOPAMIDOL (ISOVUE-300) INJECTION 61% COMPARISON:  01/14/2011 FINDINGS: Lower chest: Lung bases are clear. No effusions. Heart is normal size. Hepatobiliary: No focal hepatic abnormality. Gallbladder unremarkable. Pancreas: No focal abnormality or ductal dilatation. Spleen: No focal abnormality.  Normal size. Adrenals/Urinary Tract: Small cyst in the midpole of the right kidney posteriorly. No hydronephrosis. Adrenal glands and urinary bladder unremarkable. Stomach/Bowel: There is circumferential wall thickening within the sigmoid colon with surrounding inflammatory change. Adjacent scattered sigmoid diverticula. Findings most compatible with active diverticulitis. Two focal low-density fluid collections are noted within the sigmoid wall, 2.4 cm and 2.0 cm respectively, likely  small intramural abscesses. Appendix is normal. Stomach and small bowel decompressed, unremarkable. Vascular/Lymphatic: No evidence of aneurysm or adenopathy. Reproductive: Prior hysterectomy.  No adnexal masses. Other: Trace free fluid in the pelvis.  No free air. Musculoskeletal: No acute bony abnormality. IMPRESSION: Scattered sigmoid diverticula. There is circumferential wall thickening within the sigmoid colon with surrounding inflammatory change compatible with active diverticulitis. Two small intramural abscesses noted. Electronically Signed   By: Charlett Nose M.D.  On: 04/17/2017 11:53    Micro Results     Recent Results (from the past 240 hour(s))  Blood Culture (routine x 2)     Status: None (Preliminary result)   Collection Time: 04/19/17  9:25 PM  Result Value Ref Range Status   Specimen Description BLOOD RIGHT ANTECUBITAL  Final   Special Requests   Final    BOTTLES DRAWN AEROBIC AND ANAEROBIC Blood Culture adequate volume   Culture NO GROWTH < 24 HOURS  Final   Report Status PENDING  Incomplete  Blood Culture (routine x 2)     Status: None (Preliminary result)   Collection Time: 04/19/17  9:25 PM  Result Value Ref Range Status   Specimen Description BLOOD RIGHT HAND  Final   Special Requests   Final    BOTTLES DRAWN AEROBIC AND ANAEROBIC Blood Culture adequate volume   Culture NO GROWTH < 24 HOURS  Final   Report Status PENDING  Incomplete       Today   Subjective    Autumn Odonnell today has no significant abdominal pain.  No fever or chills.  No nausea is a little lot better.         Patient has been seen and examined prior to discharge   Objective   Blood pressure 111/79, pulse 79, temperature 98.4 F (36.9 C), temperature source Oral, resp. rate 18, height 5\' 3"  (1.6 m), weight 108 kg (238 lb), SpO2 97 %.   Intake/Output Summary (Last 24 hours) at 04/21/17 1004 Last data filed at 04/21/17 0201  Gross per 24 hour  Intake              220 ml  Output                 0 ml  Net              220 ml    Exam Gen:- Awake   HEENT:- [place].AT Neck-Supple Neck,No JVD,  Lungs-clear to auscultation bilaterally CV- S1, S2 normal Abd-  +ve B.Sounds, Abd Soft, minimal left lower quadrants tenderness,    Extremity/Skin:- Intact peripheral pulses     Data Review   CBC w Diff: Lab Results  Component Value Date   WBC 9.5 04/21/2017   HGB 10.9 (L) 04/21/2017   HCT 33.6 (L) 04/21/2017   PLT 286 04/21/2017   LYMPHOPCT 12 04/19/2017   MONOPCT 11 04/19/2017   EOSPCT 1 04/19/2017   BASOPCT 0 04/19/2017    CMP: Lab Results  Component Value Date   NA 138 04/21/2017   K 3.3 (L) 04/21/2017   CL 106 04/21/2017   CO2 26 04/21/2017   BUN <5 (L) 04/21/2017   CREATININE 0.70 04/21/2017   CREATININE 0.71 03/13/2012   PROT 7.4 04/19/2017   ALBUMIN 3.3 (L) 04/19/2017   BILITOT 0.6 04/19/2017   ALKPHOS 89 04/19/2017   AST 23 04/19/2017   ALT 22 04/19/2017  .   Total Discharge time is about 33 minutes  OSEI-BONSU,Antoine Vandermeulen M.D on 04/21/2017 at 10:04 AM  [facility]   Office  [phone]  Dragon dictation system was used to create this note, attempts have been made to correct errors, however presence of uncorrected errors is not a reflection quality of care provided

## 2017-04-24 LAB — CULTURE, BLOOD (ROUTINE X 2)
CULTURE: NO GROWTH
Culture: NO GROWTH
SPECIAL REQUESTS: ADEQUATE
Special Requests: ADEQUATE

## 2019-09-06 ENCOUNTER — Ambulatory Visit: Payer: Self-pay | Attending: Internal Medicine

## 2019-09-06 DIAGNOSIS — Z23 Encounter for immunization: Secondary | ICD-10-CM | POA: Insufficient documentation

## 2019-09-06 NOTE — Progress Notes (Signed)
   Covid-19 Vaccination Clinic  Name:  Autumn Odonnell    MRN: [id number] DOB: [birth year]  09/06/2019  [name] was observed post Covid-19 immunization for 15 minutes without incident. She was provided with Vaccine Information Sheet and instruction to access the V-Safe system.   [name] was instructed to call 911 with any severe reactions post vaccine: Marland Kitchen Difficulty breathing  . Swelling of face and throat  . A fast heartbeat  . A bad rash all over body  . Dizziness and weakness   Immunizations Administered    Name Date Dose VIS Date Route   Pfizer COVID-19 Vaccine 09/06/2019  2:02 PM 0.3 mL 06/13/2019 Intramuscular   Manufacturer: [employer], [employer]   Lot: [id number]   NDC: [address]-7

## 2019-09-27 ENCOUNTER — Ambulatory Visit: Payer: Self-pay | Attending: Internal Medicine

## 2019-09-27 DIAGNOSIS — Z23 Encounter for immunization: Secondary | ICD-10-CM

## 2019-09-27 NOTE — Progress Notes (Signed)
   Covid-19 Vaccination Clinic  Name:  Autumn Odonnell    MRN: [id number] DOB: July 16, 1965  09/27/2019  [name] was observed post Covid-19 immunization for 15 minutes without incident. She was provided with Vaccine Information Sheet and instruction to access the V-Safe system.   [name] was instructed to call 911 with any severe reactions post vaccine: Marland Kitchen Difficulty breathing  . Swelling of face and throat  . A fast heartbeat  . A bad rash all over body  . Dizziness and weakness   Immunizations Administered    Name Date Dose VIS Date Route   Pfizer COVID-19 Vaccine 09/27/2019  3:10 PM 0.3 mL 06/13/2019 Intramuscular   Manufacturer: [employer], [employer]   Lot: [id number]   NDC: [address]-3

## 2024-02-21 ENCOUNTER — Other Ambulatory Visit (HOSPITAL_COMMUNITY): Payer: Self-pay | Admitting: Orthopedic Surgery

## 2024-02-21 DIAGNOSIS — S66121A Laceration of flexor muscle, fascia and tendon of left index finger at wrist and hand level, initial encounter: Secondary | ICD-10-CM

## 2024-02-26 ENCOUNTER — Ambulatory Visit (HOSPITAL_COMMUNITY)

## 2024-02-26 ENCOUNTER — Encounter (HOSPITAL_COMMUNITY): Payer: Self-pay
